# Patient Record
Sex: Female | Born: 1980 | Race: White | Hispanic: No | Marital: Married | Smoking: Never smoker
Health system: Southern US, Community
[De-identification: ages and names within clinical notes are randomized; demographics above are authoritative.]

## PROBLEM LIST (undated history)

## (undated) DIAGNOSIS — F419 Anxiety disorder, unspecified: Secondary | ICD-10-CM

## (undated) DIAGNOSIS — K5090 Crohn's disease, unspecified, without complications: Secondary | ICD-10-CM

## (undated) DIAGNOSIS — K589 Irritable bowel syndrome without diarrhea: Secondary | ICD-10-CM

## (undated) HISTORY — PX: NO PAST SURGERIES: SHX2092

## (undated) HISTORY — DX: Irritable bowel syndrome without diarrhea: K58.9

## (undated) HISTORY — DX: Anxiety disorder, unspecified: F41.9

## (undated) HISTORY — PX: BREAST LUMPECTOMY: SHX2

---

## 2017-03-30 ENCOUNTER — Other Ambulatory Visit: Payer: Self-pay | Admitting: Gastroenterology

## 2017-03-30 DIAGNOSIS — R110 Nausea: Secondary | ICD-10-CM

## 2017-03-30 DIAGNOSIS — R1033 Periumbilical pain: Secondary | ICD-10-CM

## 2017-03-30 DIAGNOSIS — R194 Change in bowel habit: Secondary | ICD-10-CM

## 2017-04-28 ENCOUNTER — Other Ambulatory Visit: Payer: Self-pay

## 2017-10-07 ENCOUNTER — Telehealth: Payer: Self-pay | Admitting: Gastroenterology

## 2017-10-07 NOTE — Telephone Encounter (Signed)
Records have been placed on Dr.Nandigam's desk for review.

## 2017-10-13 NOTE — Telephone Encounter (Signed)
Dr. Silverio Decamp reviewed records and has accepted patient. Ok to schedule Office Visit. Left message for patient to return my call

## 2017-10-14 ENCOUNTER — Encounter: Payer: Self-pay | Admitting: Gastroenterology

## 2017-11-02 DIAGNOSIS — E538 Deficiency of other specified B group vitamins: Secondary | ICD-10-CM

## 2017-11-02 HISTORY — DX: Deficiency of other specified B group vitamins: E53.8

## 2017-12-02 ENCOUNTER — Encounter: Payer: Self-pay | Admitting: Gastroenterology

## 2017-12-02 ENCOUNTER — Encounter (INDEPENDENT_AMBULATORY_CARE_PROVIDER_SITE_OTHER): Payer: Self-pay

## 2017-12-02 ENCOUNTER — Ambulatory Visit (INDEPENDENT_AMBULATORY_CARE_PROVIDER_SITE_OTHER)
Admission: RE | Admit: 2017-12-02 | Discharge: 2017-12-02 | Disposition: A | Discharge status: Home / Self Care | Payer: BLUE CROSS/BLUE SHIELD | Source: Ambulatory Visit | Attending: Gastroenterology | Admitting: Gastroenterology

## 2017-12-02 ENCOUNTER — Ambulatory Visit: Payer: BLUE CROSS/BLUE SHIELD | Admitting: Gastroenterology

## 2017-12-02 VITALS — BP 108/60 | HR 63 | Ht 65.2 in | Wt 165.6 lb

## 2017-12-02 DIAGNOSIS — K5909 Other constipation: Secondary | ICD-10-CM | POA: Diagnosis not present

## 2017-12-02 DIAGNOSIS — R197 Diarrhea, unspecified: Secondary | ICD-10-CM

## 2017-12-02 DIAGNOSIS — R109 Unspecified abdominal pain: Secondary | ICD-10-CM

## 2017-12-02 DIAGNOSIS — R1084 Generalized abdominal pain: Secondary | ICD-10-CM

## 2017-12-02 DIAGNOSIS — R110 Nausea: Secondary | ICD-10-CM | POA: Diagnosis not present

## 2017-12-02 MED ORDER — LINACLOTIDE 145 MCG PO CAPS: 145.0000 ug | ORAL_CAPSULE | Freq: Every day | ORAL | 0 refills | Status: DC

## 2017-12-02 NOTE — Progress Notes (Signed)
Lacey Hess    [id number]    07-Jul-1981  Primary Care Physician:Bailey, Lavella Lemons, CNM  Referring Physician: No referring provider defined for this encounter.  Chief complaint: Abdominal pain, constipation, nausea  HPI: 37 year old female here for new patient visit with complaints of generalized abdominal pain, more in the lower abdominal region worse in the past year.  She has always had issues with her bowels since she was a child.  She has chronic constipation with bowel movement every 4-5 days despite taking Colace every day at bedtime.  She has multiple bowel movements with formed to semi-formed stool, sometimes liquid for 1 or 2 days after no bowel movement for 4-5 days.  She does not feel like she has evacuated completely even when she has a bowel movement.  She feels bloated with excessive gas and also has nausea almost daily basis.  Denies any vomiting, dysphagia or odynophagia. Denies any weight loss.  No blood per rectum.  No family history of colon cancer or IBD.   Outpatient Encounter Medications as of 12/02/2017  Medication Sig  . AMBULATORY NON FORMULARY MEDICATION Medication Name: IB Guard one daily probiotic  . docusate sodium (COLACE) 100 MG capsule Take 100 mg by mouth 2 (two) times daily.  Marland Kitchen etonogestrel-ethinyl estradiol (NUVARING) 0.12-0.015 MG/24HR vaginal ring Place 1 each vaginally every 28 (twenty-eight) days. Insert vaginally and leave in place for 3 consecutive weeks, then remove for 1 week.  . linaclotide (LINZESS) 145 MCG CAPS capsule Take 1 capsule (145 mcg total) by mouth daily before breakfast.   No facility-administered encounter medications on file as of 12/02/2017.     Allergies as of 12/02/2017  . (No Known Allergies)    Past Medical History:  Diagnosis Date  . Anxiety   . IBS (irritable bowel syndrome)     History reviewed. No pertinent surgical history.  Family History  Problem Relation Age of Onset  . Breast cancer Maternal  Grandfather   . Diabetes Maternal Aunt     Social History   Socioeconomic History  . Marital status: Married    Spouse name: Not on file  . Number of children: Not on file  . Years of education: Not on file  . Highest education level: Not on file  Social Needs  . Financial resource strain: Not on file  . Food insecurity - worry: Not on file  . Food insecurity - inability: Not on file  . Transportation needs - medical: Not on file  . Transportation needs - non-medical: Not on file  Occupational History  . Not on file  Tobacco Use  . Smoking status: Never Smoker  . Smokeless tobacco: Never Used  Substance and Sexual Activity  . Alcohol use: No    Frequency: Never  . Drug use: No  . Sexual activity: Not on file  Other Topics Concern  . Not on file  Social History Narrative  . Not on file      Review of systems: Review of Systems  Constitutional: Negative for fever and chills. Positive for fatigue HENT: Negative.   Eyes: Negative for blurred vision.  Respiratory: Negative for cough, shortness of breath and wheezing.   Cardiovascular: Negative for chest pain and palpitations.  Gastrointestinal: as per HPI Genitourinary: Negative for dysuria, urgency, frequency and hematuria.  Musculoskeletal: Positive for myalgias, back pain and joint pain.  Skin: Negative for itching and rash.  Neurological: Negative for dizziness, tremors, focal weakness, seizures and loss  of consciousness.  Endo/Heme/Allergies: Positive for seasonal allergies.  Psychiatric/Behavioral: Negative for depression, suicidal ideas and hallucinations.  Positive for anxiety  All other systems reviewed and are negative.   Physical Exam: Vitals:   12/02/17 0850  BP: 108/60  Pulse: 63   Body mass index is 27.39 kg/m. Gen:      No acute distress HEENT:  EOMI, sclera anicteric Neck:     No masses; no thyromegaly Lungs:    Clear to auscultation bilaterally; normal respiratory effort CV:         Regular  rate and rhythm; no murmurs Abd:      + bowel sounds; soft, non-tender; no palpable masses, no distension Ext:    No edema; adequate peripheral perfusion Skin:      Warm and dry; no rash Neuro: alert and oriented x 3 Psych: normal mood and affect  Data Reviewed:  Reviewed labs, radiology imaging, old records and pertinent past GI work up   Assessment and Plan/Recommendations:  37 year old female with nausea, bloating, generalized abdominal pain associated with constipation with alternating diarrhea Patient appears to be suffering from constipation predominantly and likely has overflow diarrhea We will obtain abdominal x-ray If has evidence of excessive stool burden, consider bowel purge with MiraLAX and Gatorade Start Linzess 145 mcg daily Increase dietary fluid and fiber intake Nausea and excessive bloating likely related to constipation, if continues to have significant symptoms despite improvement of constipation will consider further evaluation Schedule for colonoscopy to exclude neoplastic lesion The risks and benefits as well as alternatives of endoscopic procedure(s) have been discussed and reviewed. All questions answered. The patient agrees to proceed.     Damaris Hippo , MD [phone] Mon-Fri 8a-5p [phone] after 5p, weekends, holidays  CC: No ref. provider found

## 2017-12-02 NOTE — Patient Instructions (Signed)
If you are age 35 or older, your body mass index should be between 23-30. Your Body mass index is 27.39 kg/m. If this is out of the aforementioned range listed, please consider follow up with your Primary Care Provider.  If you are age 33 or younger, your body mass index should be between 19-25. Your Body mass index is 27.39 kg/m. If this is out of the aformentioned range listed, please consider follow up with your Primary Care Provider.   Go to the basement today for your chest xray   You have been scheduled for a colonoscopy. Please follow written instructions given to you at your visit today.  Please pick up your prep supplies at the pharmacy within the next 1-3 days. If you use inhalers (even only as needed), please bring them with you on the day of your procedure.  We have given you samples of the following medication to take: Linzess 145 mcg if these work for you call us back for a prescription   Constipation, Adult Constipation is when a person:  Poops (has a bowel movement) fewer times in a week than normal.  Has a hard time pooping.  Has poop that is dry, hard, or bigger than normal.  Follow these instructions at home: Eating and drinking   Eat foods that have a lot of fiber, such as: ? Fresh fruits and vegetables. ? Whole grains. ? Beans.  Eat less of foods that are high in fat, low in fiber, or overly processed, such as: ? [place] fries. ? Hamburgers. ? Cookies. ? Candy. ? Soda.  Drink enough fluid to keep your pee (urine) clear or pale yellow. General instructions  Exercise regularly or as told by your doctor.  Go to the restroom when you feel like you need to poop. Do not hold it in.  Take over-the-counter and prescription medicines only as told by your doctor. These include any fiber supplements.  Do pelvic floor retraining exercises, such as: ? Doing deep breathing while relaxing your lower belly (abdomen). ? Relaxing your pelvic floor while  pooping.  Watch your condition for any changes.  Keep all follow-up visits as told by your doctor. This is important. Contact a doctor if:  You have pain that gets worse.  You have a fever.  You have not pooped for 4 days.  You throw up (vomit).  You are not hungry.  You lose weight.  You are bleeding from the anus.  You have thin, pencil-like poop (stool). Get help right away if:  You have a fever, and your symptoms suddenly get worse.  You leak poop or have blood in your poop.  Your belly feels hard or bigger than normal (is bloated).  You have very bad belly pain.  You feel dizzy or you faint. This information is not intended to replace advice given to you by your health care provider. Make sure you discuss any questions you have with your health care provider. Document Released: 04/06/2008 Document Revised: 05/08/2016 Document Reviewed: 04/08/2016 Elsevier Interactive Patient Education  2018 [employer].

## 2017-12-03 DIAGNOSIS — K5909 Other constipation: Secondary | ICD-10-CM | POA: Insufficient documentation

## 2017-12-03 DIAGNOSIS — K5090 Crohn's disease, unspecified, without complications: Secondary | ICD-10-CM

## 2017-12-03 HISTORY — PX: COLONOSCOPY: SHX174

## 2017-12-03 HISTORY — DX: Crohn's disease, unspecified, without complications: K50.90

## 2017-12-06 ENCOUNTER — Telehealth: Payer: Self-pay

## 2017-12-06 ENCOUNTER — Other Ambulatory Visit (INDEPENDENT_AMBULATORY_CARE_PROVIDER_SITE_OTHER): Payer: BLUE CROSS/BLUE SHIELD

## 2017-12-06 ENCOUNTER — Other Ambulatory Visit: Payer: Self-pay | Admitting: *Deleted

## 2017-12-06 ENCOUNTER — Encounter: Payer: Self-pay | Admitting: Gastroenterology

## 2017-12-06 ENCOUNTER — Ambulatory Visit (AMBULATORY_SURGERY_CENTER): Payer: BLUE CROSS/BLUE SHIELD | Admitting: Gastroenterology

## 2017-12-06 VITALS — BP 107/52 | HR 55 | Temp 98.2°F | Resp 10 | Ht 62.0 in | Wt 165.0 lb

## 2017-12-06 DIAGNOSIS — R109 Unspecified abdominal pain: Secondary | ICD-10-CM

## 2017-12-06 DIAGNOSIS — R1030 Lower abdominal pain, unspecified: Secondary | ICD-10-CM

## 2017-12-06 DIAGNOSIS — K5900 Constipation, unspecified: Secondary | ICD-10-CM

## 2017-12-06 DIAGNOSIS — K633 Ulcer of intestine: Secondary | ICD-10-CM | POA: Diagnosis not present

## 2017-12-06 DIAGNOSIS — K263 Acute duodenal ulcer without hemorrhage or perforation: Secondary | ICD-10-CM

## 2017-12-06 DIAGNOSIS — R197 Diarrhea, unspecified: Secondary | ICD-10-CM

## 2017-12-06 LAB — IBC PANEL
Iron: 128 ug/dL (ref 42–145)
Saturation Ratios: 28.4 % (ref 20.0–50.0)
Transferrin: 322 mg/dL (ref 212.0–360.0)

## 2017-12-06 LAB — COMPREHENSIVE METABOLIC PANEL
ALK PHOS: 43 U/L (ref 39–117)
ALT: 11 U/L (ref 0–35)
AST: 15 U/L (ref 0–37)
Albumin: 3.8 g/dL (ref 3.5–5.2)
BILIRUBIN TOTAL: 0.7 mg/dL (ref 0.2–1.2)
BUN: 4 mg/dL — AB (ref 6–23)
CO2: 23 meq/L (ref 19–32)
CREATININE: 0.7 mg/dL (ref 0.40–1.20)
Calcium: 8.9 mg/dL (ref 8.4–10.5)
Chloride: 104 mEq/L (ref 96–112)
GFR: 101.44 mL/min (ref >60.00)
GLUCOSE: 73 mg/dL (ref 70–99)
Potassium: 4.3 mEq/L (ref 3.5–5.1)
SODIUM: 138 meq/L (ref 135–145)
TOTAL PROTEIN: 6.5 g/dL (ref 6.0–8.3)

## 2017-12-06 LAB — CBC WITH DIFFERENTIAL/PLATELET
BASOS ABS: 0 10*3/uL (ref 0.0–0.1)
Basophils Relative: 1 % (ref 0.0–3.0)
Eosinophils Absolute: 0 10*3/uL (ref 0.0–0.7)
Eosinophils Relative: 0.3 % (ref 0.0–5.0)
HCT: 35.7 % — ABNORMAL LOW (ref 36.0–46.0)
Hemoglobin: 12.2 g/dL (ref 12.0–15.0)
LYMPHS ABS: 1.7 10*3/uL (ref 0.7–4.0)
Lymphocytes Relative: 36.7 % (ref 12.0–46.0)
MCHC: 34.1 g/dL (ref 30.0–36.0)
MCV: 91.1 fl (ref 78.0–100.0)
MONO ABS: 0.3 10*3/uL (ref 0.1–1.0)
MONOS PCT: 6.5 % (ref 3.0–12.0)
NEUTROS ABS: 2.6 10*3/uL (ref 1.4–7.7)
NEUTROS PCT: 55.5 % (ref 43.0–77.0)
PLATELETS: 223 10*3/uL (ref 150.0–400.0)
RBC: 3.92 Mil/uL (ref 3.87–5.11)
RDW: 13 % (ref 11.5–15.5)
WBC: 4.6 10*3/uL (ref 4.0–10.5)

## 2017-12-06 LAB — FOLATE: Folate: >23.6 ng/mL (ref >5.9)

## 2017-12-06 LAB — VITAMIN B12: Vitamin B-12: 221 pg/mL (ref 211–911)

## 2017-12-06 LAB — C-REACTIVE PROTEIN: CRP: 0.6 mg/dL (ref 0.5–20.0)

## 2017-12-06 LAB — FERRITIN: FERRITIN: 30.5 ng/mL (ref 10.0–291.0)

## 2017-12-06 MED ORDER — SODIUM CHLORIDE 0.9 % IV SOLN: 500.0000 mL | INTRAVENOUS | Status: DC

## 2017-12-06 MED ORDER — BUDESONIDE 3 MG PO CPEP: 9.0000 mg | ORAL_CAPSULE | Freq: Every day | ORAL | 3 refills | Status: DC

## 2017-12-06 NOTE — Patient Instructions (Signed)
Handouts given for Crohn's disease.  NO ASPIRIN, IBUPROFEN, NAPROXEN, OR OTHER NSAIDS.   YOU HAD AN ENDOSCOPIC PROCEDURE TODAY AT [facility]:   Refer to the procedure report that was given to you for any specific questions about what was found during the examination.  If the procedure report does not answer your questions, please call your gastroenterologist to clarify.  If you requested that your care partner not be given the details of your procedure findings, then the procedure report has been included in a sealed envelope for you to review at your convenience later.  YOU SHOULD EXPECT: Some feelings of bloating in the abdomen. Passage of more gas than usual.  Walking can help get rid of the air that was put into your GI tract during the procedure and reduce the bloating. If you had a lower endoscopy (such as a colonoscopy or flexible sigmoidoscopy) you may notice spotting of blood in your stool or on the toilet paper. If you underwent a bowel prep for your procedure, you may not have a normal bowel movement for a few days.  Please Note:  You might notice some irritation and congestion in your nose or some drainage.  This is from the oxygen used during your procedure.  There is no need for concern and it should clear up in a day or so.  SYMPTOMS TO REPORT IMMEDIATELY:   Following lower endoscopy (colonoscopy or flexible sigmoidoscopy):  Excessive amounts of blood in the stool  Significant tenderness or worsening of abdominal pains  Swelling of the abdomen that is new, acute  Fever of 100F or higher  For urgent or emergent issues, a gastroenterologist can be reached at any hour by calling [phone].   DIET:  We do recommend a small meal at first, but then you may proceed to your regular diet.  Drink plenty of fluids but you should avoid alcoholic beverages for 24 hours.  ACTIVITY:  You should plan to take it easy for the rest of today and you should NOT DRIVE or use  heavy machinery until tomorrow (because of the sedation medicines used during the test).    FOLLOW UP: Our staff will call the number listed on your records the next business day following your procedure to check on you and address any questions or concerns that you may have regarding the information given to you following your procedure. If we do not reach you, we will leave a message.  However, if you are feeling well and you are not experiencing any problems, there is no need to return our call.  We will assume that you have returned to your regular daily activities without incident.  If any biopsies were taken you will be contacted by phone or by letter within the next 1-3 weeks.  Please call us at [phone] if you have not heard about the biopsies in 3 weeks.    SIGNATURES/CONFIDENTIALITY: You and/or your care partner have signed paperwork which will be entered into your electronic medical record.  These signatures attest to the fact that that the information above on your After Visit Summary has been reviewed and is understood.  Full responsibility of the confidentiality of this discharge information lies with you and/or your care-partner.

## 2017-12-06 NOTE — Progress Notes (Signed)
Report given to PACU, vss 

## 2017-12-06 NOTE — Progress Notes (Signed)
Called to room to assist during endoscopic procedure.  Patient ID and intended procedure confirmed with present staff. Received instructions for my participation in the procedure from the performing physician.  

## 2017-12-06 NOTE — Progress Notes (Signed)
0920, Up to bathroom, simethicone given sublingual 0.25, RLQ pain 4-5.

## 2017-12-06 NOTE — Op Note (Signed)
[facility] Patient Name: Francetta Ilg Procedure Date: 12/06/2017 7:59 AM MRN: [id number] Endoscopist: Mauri Pole , MD Age: 37 Referring MD:  Date of Birth: 07-Mar-1981 Gender: Female Account #: [id number] Procedure:                Colonoscopy Indications:              Obtain more precise diagnosis of inflammatory bowel                            disease, Generalized abdominal pain Medicines:                Monitored Anesthesia Care Procedure:                Pre-Anesthesia Assessment:                           - Prior to the procedure, a History and Physical                            was performed, and patient medications and                            allergies were reviewed. The patient's tolerance of                            previous anesthesia was also reviewed. The risks                            and benefits of the procedure and the sedation                            options and risks were discussed with the patient.                            All questions were answered, and informed consent                            was obtained. Prior Anticoagulants: The patient has                            taken no previous anticoagulant or antiplatelet                            agents. ASA Grade Assessment: I - A normal, healthy                            patient. After reviewing the risks and benefits,                            the patient was deemed in satisfactory condition to                            undergo the procedure.  After obtaining informed consent, the colonoscope                            was passed under direct vision. Throughout the                            procedure, the patient's blood pressure, pulse, and                            oxygen saturations were monitored continuously. The                            Colonoscope was introduced through the anus and                            advanced to the the terminal  ileum, with                            identification of the appendiceal orifice and IC                            valve. The colonoscopy was performed without                            difficulty. The patient tolerated the procedure                            well. The quality of the bowel preparation was                            excellent. The terminal ileum, ileocecal valve,                            appendiceal orifice, and rectum were photographed. Scope In: 8:04:18 AM Scope Out: 8:26:28 AM Scope Withdrawal Time: 0 hours 13 minutes 14 seconds  Total Procedure Duration: 0 hours 22 minutes 10 seconds  Findings:                 The perianal and digital rectal examinations were                            normal.                           The colon (entire examined portion) appeared                            normal. Biopsies were taken with a cold forceps for                            histology from rectum to exclude Crohn's disease                           The terminal ileum contained multiple ulcers with  erythematous friable mucosa. No active bleeding was                            present. No stigmata of recent bleeding were seen.                            Biopsies were taken with a cold forceps for                            histology to exclude/confirm Crohn's disease . Complications:            No immediate complications. Estimated Blood Loss:     Estimated blood loss was minimal. Impression:               - The entire examined colon is normal. Biopsied.                           - Multiple ulcers in the terminal ileum. Biopsied. Recommendation:           - Patient has a contact number available for                            emergencies. The signs and symptoms of potential                            delayed complications were discussed with the                            patient. Return to normal activities tomorrow.                             Written discharge instructions were provided to the                            patient.                           - Resume previous diet.                           - Continue present medications.                           - Await pathology results.                           - No aspirin, ibuprofen, naproxen, or other                            non-steroidal anti-inflammatory drugs.                           - Perform a computed tomographic (CT scan)                            enterography at appointment to be scheduled.                           -  Check CBC, CMP, CRP, Ferrition, iron panel,                            folate, B12, Hep A, B, & C and Quantiferon gold TB                            to be scheduled                           - Use budesonide 9 mg PO one time per day X 30 days                            with 3 refills.                           - Repeat colonoscopy date to be determined after                            pending pathology results are reviewed for                            surveillance after piecemeal polypectomy.                           - Return to GI clinic in 1 month. Mauri Pole, MD 12/06/2017 8:36:21 AM This report has been signed electronically.

## 2017-12-06 NOTE — Telephone Encounter (Signed)
Patient calls due to persistent abdominal discomfort after the colonoscopy this morning. She describes it as a gassy cramping pain that centers near the umbilicus. It is different from the abdominal pain she usually experiences. She does feel it has increased in intensity since this morning. She is passing gas but this does not decrease her level of discomfort. She is not nauseous. Please advise. Thank you. Patient can be reached at [phone]

## 2017-12-06 NOTE — Progress Notes (Signed)
Pt's states no medical or surgical changes since previsit or office visit. 

## 2017-12-06 NOTE — Progress Notes (Signed)
0920-- pt in bathroom, simethicone given.   0930-- hot tea given, pt on hands and knees, reports she is feeling some better

## 2017-12-06 NOTE — Telephone Encounter (Signed)
Called patient back. She feels full and gassy, has pressure in the mid abdomen. Tolerating PO. No fever, nausea or vomiting. Advised patient to come to ER for evaluation. She had aphthous ulcers and inflammation in the terminal ileum concerning for Crohn's

## 2017-12-07 ENCOUNTER — Telehealth: Payer: Self-pay

## 2017-12-07 ENCOUNTER — Other Ambulatory Visit: Payer: Self-pay

## 2017-12-07 MED ORDER — CYANOCOBALAMIN 1000 MCG/ML IJ SOLN: 1000.0000 ug | INTRAMUSCULAR | 12 refills | Status: DC

## 2017-12-07 NOTE — Telephone Encounter (Signed)
Name identifier, left a voice message.

## 2017-12-07 NOTE — Telephone Encounter (Signed)
  Follow up Call-  Call back number 12/06/2017  Post procedure Call Back phone  # [phone]  Permission to leave phone message Yes     Left message

## 2017-12-07 NOTE — Telephone Encounter (Signed)
ok 

## 2017-12-07 NOTE — Telephone Encounter (Signed)
Spoke with the patient. She has returned to work. No further problems or discomfort. She did not go to ED or an urgent care.

## 2017-12-08 ENCOUNTER — Ambulatory Visit: Payer: BLUE CROSS/BLUE SHIELD | Admitting: Gastroenterology

## 2017-12-08 ENCOUNTER — Ambulatory Visit (INDEPENDENT_AMBULATORY_CARE_PROVIDER_SITE_OTHER)
Admission: RE | Admit: 2017-12-08 | Discharge: 2017-12-08 | Disposition: A | Discharge status: Home / Self Care | Payer: BLUE CROSS/BLUE SHIELD | Source: Ambulatory Visit | Attending: Gastroenterology | Admitting: Gastroenterology

## 2017-12-08 DIAGNOSIS — R109 Unspecified abdominal pain: Secondary | ICD-10-CM

## 2017-12-08 DIAGNOSIS — K633 Ulcer of intestine: Secondary | ICD-10-CM | POA: Diagnosis not present

## 2017-12-08 DIAGNOSIS — E538 Deficiency of other specified B group vitamins: Secondary | ICD-10-CM

## 2017-12-08 LAB — HEPATITIS B SURFACE ANTIGEN: HEP B S AG: NONREACTIVE

## 2017-12-08 LAB — QUANTIFERON-TB GOLD PLUS
Mitogen-NIL: >10 IU/mL
NIL: 0.08 IU/mL
QuantiFERON-TB Gold Plus: NEGATIVE
TB1-NIL: 0.01 IU/mL
TB2-NIL: 0.01 IU/mL

## 2017-12-08 LAB — HEPATITIS A ANTIBODY, TOTAL: Hepatitis A AB,Total: REACTIVE — AB

## 2017-12-08 LAB — HEPATITIS B SURFACE ANTIBODY,QUALITATIVE: HEP B S AB: REACTIVE — AB

## 2017-12-08 LAB — HEPATITIS C ANTIBODY
HEP C AB: NONREACTIVE
SIGNAL TO CUT-OFF: 0.02 (ref <1.00)

## 2017-12-08 MED ORDER — IOPAMIDOL (ISOVUE-300) INJECTION 61%
100.0000 mL | Freq: Once | INTRAVENOUS | Status: AC | PRN
  Administered 2017-12-08: 100 mL via INTRAVENOUS

## 2017-12-09 ENCOUNTER — Other Ambulatory Visit: Payer: Self-pay

## 2017-12-09 MED ORDER — LINACLOTIDE 145 MCG PO CAPS: 145.0000 ug | ORAL_CAPSULE | Freq: Every day | ORAL | 3 refills | Status: DC

## 2017-12-09 NOTE — Progress Notes (Signed)
Patient comes in for instructions to self administer her B12 injections. Patient demonstrated proper technique, washing her hands, injection site prep, drawing up the injection , self administering and disposal of used syringe.  Questions invited and answered.  Her partner was in attendance to support her and learn also.

## 2017-12-15 ENCOUNTER — Telehealth: Payer: Self-pay | Admitting: *Deleted

## 2017-12-15 NOTE — Telephone Encounter (Signed)
Dr. Silverio Decamp,  When would you like a recall colonoscopy for this patient?  Thank you, Olivia Mackie

## 2017-12-15 NOTE — Telephone Encounter (Signed)
10 years recall for colonoscopy. Thanks

## 2018-02-03 ENCOUNTER — Telehealth: Payer: Self-pay | Admitting: Gastroenterology

## 2018-02-03 NOTE — Telephone Encounter (Signed)
Left message to call.

## 2018-02-04 NOTE — Telephone Encounter (Signed)
Spoke with the patient. She is experiencing constipation despite Linzess 145 mcg. She is wanting to use Miralax and asks about purging. Okay to purge? Should she continue with daily Miralax or increase the Linzess? She has an appointment on 02/08/18 for follow up.

## 2018-02-04 NOTE — Telephone Encounter (Signed)
Left the patient a message offering this advice and samples of Linzess 290 mcg to pick up at the front desk.

## 2018-02-04 NOTE — Telephone Encounter (Signed)
Patient returned phone call stating that she is in [place] and would love samples but I did not see any samples at the front desk for her.

## 2018-02-04 NOTE — Telephone Encounter (Signed)
Ok to do bowel purge with Miralax. Please increase Linzess dose to 249mg daily. Follow up in office visit. Thanks

## 2018-02-04 NOTE — Telephone Encounter (Signed)
The samples are at the front desk, they just got buried in the drawer. Patient is no longer in [place] but will try to get someone to come in for the samples.

## 2018-02-04 NOTE — Telephone Encounter (Signed)
Patient returned phone call. °

## 2018-02-08 ENCOUNTER — Encounter: Payer: Self-pay | Admitting: Gastroenterology

## 2018-02-08 ENCOUNTER — Encounter (INDEPENDENT_AMBULATORY_CARE_PROVIDER_SITE_OTHER): Payer: Self-pay

## 2018-02-08 ENCOUNTER — Ambulatory Visit: Payer: BLUE CROSS/BLUE SHIELD | Admitting: Gastroenterology

## 2018-02-08 ENCOUNTER — Ambulatory Visit (INDEPENDENT_AMBULATORY_CARE_PROVIDER_SITE_OTHER)
Admission: RE | Admit: 2018-02-08 | Discharge: 2018-02-08 | Disposition: A | Discharge status: Home / Self Care | Payer: BLUE CROSS/BLUE SHIELD | Source: Ambulatory Visit | Attending: Gastroenterology | Admitting: Gastroenterology

## 2018-02-08 VITALS — BP 94/60 | HR 60 | Ht 65.75 in | Wt 162.0 lb

## 2018-02-08 DIAGNOSIS — K5000 Crohn's disease of small intestine without complications: Secondary | ICD-10-CM | POA: Diagnosis not present

## 2018-02-08 DIAGNOSIS — K5909 Other constipation: Secondary | ICD-10-CM

## 2018-02-08 DIAGNOSIS — R197 Diarrhea, unspecified: Secondary | ICD-10-CM

## 2018-02-08 DIAGNOSIS — R140 Abdominal distension (gaseous): Secondary | ICD-10-CM | POA: Diagnosis not present

## 2018-02-08 DIAGNOSIS — R109 Unspecified abdominal pain: Secondary | ICD-10-CM | POA: Diagnosis not present

## 2018-02-08 DIAGNOSIS — R1030 Lower abdominal pain, unspecified: Secondary | ICD-10-CM

## 2018-02-08 DIAGNOSIS — Z8719 Personal history of other diseases of the digestive system: Secondary | ICD-10-CM | POA: Diagnosis not present

## 2018-02-08 MED ORDER — BUDESONIDE 3 MG PO CPEP: 9.0000 mg | ORAL_CAPSULE | Freq: Every day | ORAL | 3 refills | Status: DC

## 2018-02-08 NOTE — Patient Instructions (Signed)
Your provider has requested that you have an abdominal x ray before leaving today. Please go to the basement floor to our Radiology department for the test.  We will send for approval for Humira treatments, Beth Dr Jillyn Hidden nurse will contact you about the approval   We will refill Budesonide   Take benefiber 1 teaspoon three times a day with meals , start once a day and increase daily

## 2018-02-08 NOTE — Progress Notes (Signed)
Lacey Hess    [id number]    10-Dec-1980  Primary Care Physician:Bailey, Lavella Lemons, CNM  Referring Physician: Artelia Laroche, [address], [place] [address]  Chief complaint: Crohn's disease HPI: 37 year old female with recent diagnosis of Crohn's disease based on colonoscopy and CT enterography. Colonoscopy December 06, 2017 showed multiple aphthous ulcers in the terminal ileum, biopsies showed erosions and active inflammation.  CT enterography with contrast showed mild wall thickening and abnormal mucosal enhancement in the terminal ileum highly suspicious for Crohn's disease.  Started on budesonide 9 mg daily.  Reports improvement of abdominal pain and has less bloating since starting budesonide.  She continues to have irregular bowel habits with alternating constipation and diarrhea.  Denies any melena or rectal bleeding.  Weight is stable     Outpatient Encounter Medications as of 02/08/2018  Medication Sig  . AMBULATORY NON FORMULARY MEDICATION Medication Name: IB Guard one daily probiotic  . budesonide (ENTOCORT EC) 3 MG 24 hr capsule Take 3 capsules (9 mg total) by mouth daily.  . cyanocobalamin (,VITAMIN B-12,) 1000 MCG/ML injection Inject 1 mL (1,000 mcg total) into the muscle every 30 (thirty) days.  Marland Kitchen etonogestrel-ethinyl estradiol (NUVARING) 0.12-0.015 MG/24HR vaginal ring Place 1 each vaginally every 28 (twenty-eight) days. Insert vaginally and leave in place for 3 consecutive weeks, then remove for 1 week.  . linaclotide (LINZESS) 145 MCG CAPS capsule Take 1 capsule (145 mcg total) by mouth daily before breakfast.  . docusate sodium (COLACE) 100 MG capsule Take 100 mg by mouth 2 (two) times daily.   Facility-Administered Encounter Medications as of 02/08/2018  Medication  . 0.9 %  sodium chloride infusion    Allergies as of 02/08/2018  . (No Known Allergies)    Past Medical History:  Diagnosis Date  . Anxiety   . IBS (irritable bowel syndrome)      History reviewed. No pertinent surgical history.  Family History  Problem Relation Age of Onset  . Breast cancer Maternal Grandfather   . Diabetes Maternal Aunt     Social History   Socioeconomic History  . Marital status: Married    Spouse name: Not on file  . Number of children: Not on file  . Years of education: Not on file  . Highest education level: Not on file  Occupational History  . Not on file  Social Needs  . Financial resource strain: Not on file  . Food insecurity:    Worry: Not on file    Inability: Not on file  . Transportation needs:    Medical: Not on file    Non-medical: Not on file  Tobacco Use  . Smoking status: Never Smoker  . Smokeless tobacco: Never Used  Substance and Sexual Activity  . Alcohol use: No    Frequency: Never  . Drug use: No  . Sexual activity: Not on file  Lifestyle  . Physical activity:    Days per week: Not on file    Minutes per session: Not on file  . Stress: Not on file  Relationships  . Social connections:    Talks on phone: Not on file    Gets together: Not on file    Attends religious service: Not on file    Active member of club or organization: Not on file    Attends meetings of clubs or organizations: Not on file    Relationship status: Not on file  . Intimate partner violence:  Fear of current or ex partner: Not on file    Emotionally abused: Not on file    Physically abused: Not on file    Forced sexual activity: Not on file  Other Topics Concern  . Not on file  Social History Narrative  . Not on file      Review of systems: Review of Systems  Constitutional: Negative for fever and chills.  Positive for lack of energy HENT: Negative.   Eyes: Negative for blurred vision.  Respiratory: Negative for cough, shortness of breath and wheezing.   Cardiovascular: Negative for chest pain and palpitations.  Gastrointestinal: as per HPI Genitourinary: Negative for dysuria, urgency, frequency and  hematuria.  Musculoskeletal: Negative for myalgias, back pain and joint pain.  Skin: Negative for itching and rash.  Neurological: Negative for dizziness, tremors, focal weakness, seizures and loss of consciousness.  Endo/Heme/Allergies: Negative for seasonal allergies.  Psychiatric/Behavioral: Negative for depression, suicidal ideas and hallucinations.  All other systems reviewed and are negative.   Physical Exam: Vitals:   02/08/18 0848  BP: 94/60  Pulse: 60   Body mass index is 26.35 kg/m. Gen:      No acute distress HEENT:  EOMI, sclera anicteric Neck:     No masses; no thyromegaly Lungs:    Clear to auscultation bilaterally; normal respiratory effort CV:         Regular rate and rhythm; no murmurs Abd:      + bowel sounds; soft, non-tender; no palpable masses, no distension Ext:    No edema; adequate peripheral perfusion Skin:      Warm and dry; no rash Neuro: alert and oriented x 3 Psych: normal mood and affect  Data Reviewed:  Reviewed labs, radiology imaging, old records and pertinent past GI work up   Assessment and Plan/Recommendations:  37 year old female with recent diagnosis of Crohn's disease with predominant involvement of small bowel specifically terminal ileum Symptoms somewhat improved with budesonide 9 mg daily, continue until can intiate on anti-TNF and achieves remission  We will submit for insurance approval to initiate Humira TB QuantiFERON negative December 06, 2017 Hepatitis B and A immune Discussed annual health maintenance with TB screening, flu vaccination and skin exam while on anti-TNF therapy  Check abdominal x-ray to evaluate for increased stool burden given complaints of abdominal fullness and alternating constipation with diarrhea, if has evidence of increased stool burden will do a bowel purge Discussed small frequent meals Start soluble fiber, Benefiber 1 teaspoon 3 times daily with meals Avoid high-fiber diet Return in 2-3 months or  sooner if needed  25 minutes was spent face-to-face with the patient. Greater than 50% of the time used for counseling as well as treatment plan and follow-up. She had multiple questions which were answered to her satisfaction  K. Denzil Magnuson , MD [phone]    CC: Artelia Laroche, CNM

## 2018-02-10 ENCOUNTER — Encounter: Payer: Self-pay | Admitting: Gastroenterology

## 2018-02-15 ENCOUNTER — Other Ambulatory Visit: Payer: Self-pay

## 2018-02-15 MED ORDER — ADALIMUMAB 40 MG/0.4ML ~~LOC~~ AJKT: 1.0000 "pen " | AUTO-INJECTOR | SUBCUTANEOUS | 6 refills | Status: DC

## 2018-02-16 ENCOUNTER — Telehealth: Payer: Self-pay | Admitting: Gastroenterology

## 2018-02-16 NOTE — Telephone Encounter (Signed)
PA submitted through Cover My Meds.

## 2018-02-22 ENCOUNTER — Other Ambulatory Visit: Payer: Self-pay

## 2018-02-22 NOTE — Telephone Encounter (Signed)
Medications they are referring to are corticosteroids, 5-aminosalicylate, azathioprine, 6 mp, metronidazole and methotrexate. Must have tried and failed.  She must have moderate to severe Crohn's.

## 2018-02-22 NOTE — Telephone Encounter (Signed)
She has moderate Crohn's disease and failed steroids (budesonide).

## 2018-02-22 NOTE — Telephone Encounter (Signed)
Humira has been denied. She has not tried and failed standard medications.

## 2018-02-22 NOTE — Telephone Encounter (Signed)
Records submitted for a "PCR". Faxed to [facility] [phone]. (Physician consult request)

## 2018-02-22 NOTE — Telephone Encounter (Signed)
Humira is supposed to be the standard medication, can you please ask them to clarify, what they want to her to fail before approval of Humira?

## 2018-02-28 NOTE — Telephone Encounter (Signed)
Approval received. BC[employer] reference number [id number] Effective dates of authorization: 02/16/2018 through 11/01/2038  Tried to call the patient back. Her voicemail is not set up.   Called the pharmacy [facility]. The pharmacy will be reaching out to the patient to set up delivery. They will not send the Rx until they have talked with her. Tried to call the patient again. No answer.  Pharmacy number [phone] Fax [phone]

## 2018-02-28 NOTE — Telephone Encounter (Signed)
Pt called to follow up on letter sent to her insurance about refill for humira.

## 2018-03-01 NOTE — Telephone Encounter (Signed)
Tried to call the patient back. Her mailbox is full and cannot accept messages.

## 2018-03-01 NOTE — Telephone Encounter (Signed)
Spoke with [name]. She will give the pharmacy phone number to her.

## 2018-03-01 NOTE — Telephone Encounter (Signed)
Patient states she received a missed call from [place] not sure what it was regarding. Best call back # [phone]

## 2018-03-11 ENCOUNTER — Telehealth: Payer: Self-pay | Admitting: Gastroenterology

## 2018-03-11 MED ORDER — LINACLOTIDE 145 MCG PO CAPS: 145.0000 ug | ORAL_CAPSULE | Freq: Every day | ORAL | 3 refills | Status: DC

## 2018-03-11 NOTE — Telephone Encounter (Signed)
Linzess sent to [facility] as requested

## 2018-03-25 ENCOUNTER — Telehealth: Payer: Self-pay | Admitting: Gastroenterology

## 2018-03-29 ENCOUNTER — Other Ambulatory Visit: Payer: Self-pay

## 2018-03-29 MED ORDER — LINACLOTIDE 290 MCG PO CAPS: 290.0000 ug | ORAL_CAPSULE | Freq: Every day | ORAL | 3 refills | Status: DC

## 2018-03-29 NOTE — Telephone Encounter (Signed)
Rx to pharmacy. Message left for the patient to advise of this.

## 2018-03-29 NOTE — Telephone Encounter (Signed)
1)She will be without insurance for the month of July. Can we ask the drug rep for 2 or 3 samples of Humira to help her? 2)48 hours after last injection she developed a headache that lasted 3 days. It was "pretty severe the first day, then became a dull h/a" No history of migraines. Birth control is Nuva ring. 3)Wants Linzess 290 mcg. Okay to send Rx?

## 2018-03-29 NOTE — Telephone Encounter (Signed)
Okay to send prescription for Linzess 290 mcg daily, 90-day supply. Agree with trying to get samples for Humira so she does not have any interruption with her injections. Please advise patient to premedicate with 500 mg Tylenol and 25 mg Benadryl prior to injecting Humira to see if that improves her symptoms.

## 2018-03-30 NOTE — Telephone Encounter (Signed)
email to Humira rep.

## 2018-04-08 ENCOUNTER — Other Ambulatory Visit: Payer: Self-pay | Admitting: *Deleted

## 2018-04-08 DIAGNOSIS — R1030 Lower abdominal pain, unspecified: Secondary | ICD-10-CM

## 2018-04-08 MED ORDER — BUDESONIDE 3 MG PO CPEP: 9.0000 mg | ORAL_CAPSULE | Freq: Every day | ORAL | 3 refills | Status: DC

## 2018-04-08 NOTE — Progress Notes (Signed)
Sent in rx per fax request from [facility]

## 2018-04-12 ENCOUNTER — Other Ambulatory Visit: Payer: Self-pay | Admitting: *Deleted

## 2018-04-12 MED ORDER — CYANOCOBALAMIN 1000 MCG/ML IJ SOLN: 1000.0000 ug | INTRAMUSCULAR | 12 refills | Status: DC

## 2018-04-12 MED ORDER — LINACLOTIDE 290 MCG PO CAPS
290.0000 ug | ORAL_CAPSULE | Freq: Every day | ORAL | 3 refills | Status: DC
Start: 2018-04-12 — End: 2018-05-10

## 2018-04-12 NOTE — Progress Notes (Signed)
Refill request fax from [facility] for Linzess and B12 injectable  Sent electronically today

## 2018-04-19 ENCOUNTER — Ambulatory Visit: Payer: BLUE CROSS/BLUE SHIELD | Admitting: Gastroenterology

## 2018-04-26 ENCOUNTER — Ambulatory Visit (INDEPENDENT_AMBULATORY_CARE_PROVIDER_SITE_OTHER): Payer: PRIVATE HEALTH INSURANCE | Admitting: Gastroenterology

## 2018-04-26 ENCOUNTER — Encounter: Payer: Self-pay | Admitting: Gastroenterology

## 2018-04-26 VITALS — BP 96/60 | HR 60 | Ht 65.75 in | Wt 160.4 lb

## 2018-04-26 DIAGNOSIS — K5900 Constipation, unspecified: Secondary | ICD-10-CM

## 2018-04-26 DIAGNOSIS — K5000 Crohn's disease of small intestine without complications: Secondary | ICD-10-CM

## 2018-04-26 NOTE — Progress Notes (Signed)
Lacey Hess    [id number]    09/24/1981  Primary Care Physician:Bailey, Lavella Lemons, CNM  Referring Physician: Artelia Laroche, [address], [place] [address]  Chief complaint:  Crohn's disease  HPI: 37 year old female with Crohn's disease predominantly ileal involvement, diagnosed in February 2019 initiated on Humira as patient had persistent symptoms despite steroids.  She is currently on maintenance dose of Humira every other week and she feels her symptoms are less severe and also she is having less frequent symptoms, predominantly abdominal bloating and discomfort. Bowel movements are more regular, once every other day. Soft stool on most days but sometimes has loose stool.  She is taking Linzess 290 mcg daily. Denies any nausea, vomiting, abdominal pain, melena or bright red blood per rectum    Outpatient Encounter Medications as of 04/26/2018  Medication Sig  . Adalimumab (HUMIRA PEN) 40 MG/0.4ML PNKT Inject 1 pen into the skin every 14 (fourteen) days.  . budesonide (ENTOCORT EC) 3 MG 24 hr capsule Take 3 capsules (9 mg total) by mouth daily.  . cyanocobalamin (,VITAMIN B-12,) 1000 MCG/ML injection Inject 1 mL (1,000 mcg total) into the muscle every 30 (thirty) days.  Marland Kitchen etonogestrel-ethinyl estradiol (NUVARING) 0.12-0.015 MG/24HR vaginal ring Place 1 each vaginally every 28 (twenty-eight) days. Insert vaginally and leave in place for 3 consecutive weeks, then remove for 1 week.  . linaclotide (LINZESS) 290 MCG CAPS capsule Take 1 capsule (290 mcg total) by mouth daily before breakfast.  . [DISCONTINUED] AMBULATORY NON FORMULARY MEDICATION Medication Name: IB Guard one daily probiotic  . [DISCONTINUED] docusate sodium (COLACE) 100 MG capsule Take 100 mg by mouth 2 (two) times daily.   Facility-Administered Encounter Medications as of 04/26/2018  Medication  . 0.9 %  sodium chloride infusion    Allergies as of 04/26/2018  . (No Known Allergies)    Past  Medical History:  Diagnosis Date  . Anxiety   . IBS (irritable bowel syndrome)     History reviewed. No pertinent surgical history.  Family History  Problem Relation Age of Onset  . Breast cancer Maternal Grandfather   . Diabetes Maternal Aunt     Social History   Socioeconomic History  . Marital status: Married    Spouse name: Not on file  . Number of children: Not on file  . Years of education: Not on file  . Highest education level: Not on file  Occupational History  . Not on file  Social Needs  . Financial resource strain: Not on file  . Food insecurity:    Worry: Not on file    Inability: Not on file  . Transportation needs:    Medical: Not on file    Non-medical: Not on file  Tobacco Use  . Smoking status: Never Smoker  . Smokeless tobacco: Never Used  Substance and Sexual Activity  . Alcohol use: No    Frequency: Never  . Drug use: No  . Sexual activity: Not on file  Lifestyle  . Physical activity:    Days per week: Not on file    Minutes per session: Not on file  . Stress: Not on file  Relationships  . Social connections:    Talks on phone: Not on file    Gets together: Not on file    Attends religious service: Not on file    Active member of club or organization: Not on file    Attends meetings of clubs or  organizations: Not on file    Relationship status: Not on file  . Intimate partner violence:    Fear of current or ex partner: Not on file    Emotionally abused: Not on file    Physically abused: Not on file    Forced sexual activity: Not on file  Other Topics Concern  . Not on file  Social History Narrative  . Not on file      Review of systems: Review of Systems  Constitutional: Negative for fever and chills.  HENT: Positive for runny nose.   Eyes: Negative for blurred vision.  Respiratory: Negative for cough, shortness of breath and wheezing.   Cardiovascular: Negative for chest pain and palpitations.  Gastrointestinal: as per  HPI Genitourinary: Negative for dysuria, urgency, frequency and hematuria.  Musculoskeletal: Negative for myalgias, back pain and joint pain.  Skin: Negative for itching and rash.  Neurological: Negative for dizziness, tremors, focal weakness, seizures and loss of consciousness.  Endo/Heme/Allergies: Negative for seasonal allergies.  Psychiatric/Behavioral: Negative for depression, suicidal ideas and hallucinations.  All other systems reviewed and are negative.   Physical Exam: Vitals:   04/26/18 1324  BP: 96/60  Pulse: 60   Body mass index is 26.08 kg/m. Gen:      No acute distress HEENT:  EOMI, sclera anicteric Neck:     No masses; no thyromegaly Lungs:    Clear to auscultation bilaterally; normal respiratory effort CV:         Regular rate and rhythm; no murmurs Abd:      + bowel sounds; soft, non-tender; no palpable masses, no distension Ext:    No edema; adequate peripheral perfusion Skin:      Warm and dry; no rash Neuro: alert and oriented x 3 Psych: normal mood and affect  Data Reviewed:  Reviewed labs, radiology imaging, old records and pertinent past GI work up   Assessment and Plan/Recommendations:  37 year old female with history of Crohn's disease, predominantly involving ileum Continue Humira 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  25 minutes was spent face-to-face with the patient. Greater than 50% of the time used for counseling as well as  treatment plan and follow-up. She had multiple questions which were answered to her satisfaction  K. Denzil Magnuson , MD [phone]    CC: Artelia Laroche, CNM

## 2018-04-26 NOTE — Patient Instructions (Addendum)
We have given you Linzess 145 mcg samples to try, if you need a prescription give [place] a call  Follow up in 6 months  If you are age 35 or older, your body mass index should be between 23-30. Your Body mass index is 26.08 kg/m. If this is out of the aforementioned range listed, please consider follow up with your Primary Care Provider.  If you are age 18 or younger, your body mass index should be between 19-25. Your Body mass index is 26.08 kg/m. If this is out of the aformentioned range listed, please consider follow up with your Primary Care Provider.    Follow up in 6 months

## 2018-05-01 ENCOUNTER — Encounter: Payer: Self-pay | Admitting: Gastroenterology

## 2018-05-10 ENCOUNTER — Telehealth: Payer: Self-pay | Admitting: Gastroenterology

## 2018-05-10 MED ORDER — LINACLOTIDE 145 MCG PO CAPS: 145.0000 ug | ORAL_CAPSULE | Freq: Every day | ORAL | 6 refills | Status: DC

## 2018-05-10 NOTE — Telephone Encounter (Signed)
Prescription sent to [facility] as requested by patient

## 2018-05-13 ENCOUNTER — Telehealth: Payer: Self-pay | Admitting: Gastroenterology

## 2018-05-13 NOTE — Telephone Encounter (Signed)
Informed patient we can leave samples of Linzess at the front desk. Patient states she will come by and get them.

## 2018-05-13 NOTE — Telephone Encounter (Signed)
Left a message for patient to return my call. 

## 2018-05-17 ENCOUNTER — Telehealth: Payer: Self-pay | Admitting: Gastroenterology

## 2018-05-17 NOTE — Telephone Encounter (Signed)
Spoke with the patient. [facility] is her new pharmacy Transport pl[facility].  Will contact the drug rep for Humira to ask for another sample. She will be due in 2 weeks. This would ensure she had continuity of her Humira injections due to the possible delay because the prior authorization process or a high co-pay.

## 2018-05-19 NOTE — Telephone Encounter (Signed)
Still waiting on her new insurance card.

## 2018-05-25 DIAGNOSIS — K5000 Crohn's disease of small intestine without complications: Secondary | ICD-10-CM | POA: Insufficient documentation

## 2018-06-15 ENCOUNTER — Other Ambulatory Visit: Payer: Self-pay

## 2018-06-15 MED ORDER — ADALIMUMAB 40 MG/0.4ML ~~LOC~~ AJKT: 1.0000 "pen " | AUTO-INJECTOR | SUBCUTANEOUS | 6 refills | Status: DC

## 2018-08-29 ENCOUNTER — Encounter (HOSPITAL_COMMUNITY): Payer: Self-pay | Admitting: Emergency Medicine

## 2018-08-29 ENCOUNTER — Other Ambulatory Visit: Payer: Self-pay

## 2018-08-29 ENCOUNTER — Telehealth: Payer: Self-pay | Admitting: Gastroenterology

## 2018-08-29 ENCOUNTER — Emergency Department (HOSPITAL_COMMUNITY)
Admission: EM | Admit: 2018-08-29 | Discharge: 2018-08-29 | Disposition: A | Discharge status: Home / Self Care | Payer: BC Managed Care – PPO | Attending: Emergency Medicine | Admitting: Emergency Medicine

## 2018-08-29 DIAGNOSIS — R1084 Generalized abdominal pain: Secondary | ICD-10-CM | POA: Insufficient documentation

## 2018-08-29 DIAGNOSIS — K5090 Crohn's disease, unspecified, without complications: Secondary | ICD-10-CM | POA: Diagnosis not present

## 2018-08-29 DIAGNOSIS — Z79899 Other long term (current) drug therapy: Secondary | ICD-10-CM | POA: Diagnosis not present

## 2018-08-29 HISTORY — DX: Crohn's disease, unspecified, without complications: K50.90

## 2018-08-29 LAB — URINALYSIS, ROUTINE W REFLEX MICROSCOPIC
BACTERIA UA: NONE SEEN
BILIRUBIN URINE: NEGATIVE
GLUCOSE, UA: NEGATIVE mg/dL
KETONES UR: NEGATIVE mg/dL
Leukocytes, UA: NEGATIVE
Nitrite: NEGATIVE
PH: 5 (ref 5.0–8.0)
Protein, ur: NEGATIVE mg/dL
Specific Gravity, Urine: 1.031 — ABNORMAL HIGH (ref 1.005–1.030)

## 2018-08-29 LAB — CBC
HEMATOCRIT: 40.2 % (ref 36.0–46.0)
Hemoglobin: 12.1 g/dL (ref 12.0–15.0)
MCH: 30 pg (ref 26.0–34.0)
MCHC: 30.1 g/dL (ref 30.0–36.0)
MCV: 99.5 fL (ref 80.0–100.0)
Platelets: 232 10*3/uL (ref 150–400)
RBC: 4.04 MIL/uL (ref 3.87–5.11)
RDW: 13 % (ref 11.5–15.5)
WBC: 6 10*3/uL (ref 4.0–10.5)
nRBC: 0 % (ref 0.0–0.2)

## 2018-08-29 LAB — COMPREHENSIVE METABOLIC PANEL
ALBUMIN: 3.7 g/dL (ref 3.5–5.0)
ALT: 13 U/L (ref 0–44)
ANION GAP: 8 (ref 5–15)
AST: 17 U/L (ref 15–41)
Alkaline Phosphatase: 40 U/L (ref 38–126)
BILIRUBIN TOTAL: 0.5 mg/dL (ref 0.3–1.2)
BUN: 8 mg/dL (ref 6–20)
CHLORIDE: 105 mmol/L (ref 98–111)
CO2: 26 mmol/L (ref 22–32)
Calcium: 9.6 mg/dL (ref 8.9–10.3)
Creatinine, Ser: 0.79 mg/dL (ref 0.44–1.00)
GFR calc Af Amer: >60 mL/min (ref >60)
Glucose, Bld: 98 mg/dL (ref 70–99)
POTASSIUM: 4.3 mmol/L (ref 3.5–5.1)
Sodium: 139 mmol/L (ref 135–145)
Total Protein: 6.2 g/dL — ABNORMAL LOW (ref 6.5–8.1)

## 2018-08-29 LAB — LIPASE, BLOOD: LIPASE: 61 U/L — AB (ref 11–51)

## 2018-08-29 LAB — I-STAT BETA HCG BLOOD, ED (MC, WL, AP ONLY): I-stat hCG, quantitative: <5 m[IU]/mL (ref <5)

## 2018-08-29 NOTE — Telephone Encounter (Signed)
Patient went to the ED to be seen while the pain was occurring. Unfortunately she was beginning to improve before she was seen.  She says she has discussed this before. She states this pain has decreased in it's frequency since starting Humira and Linzess. She does not feel she is constipated but admits she does not feel she is emptying completely on some days. Weekends she "goes" more often. Any thoughts?  Is she due follow up?

## 2018-08-29 NOTE — ED Provider Notes (Signed)
[facility] EMERGENCY DEPARTMENT Provider Note   CSN: [id number] Arrival date & time: 08/29/18  3888     History   Chief Complaint Chief Complaint  Patient presents with  . Abdominal Pain    HPI Lacey Hess is a 37 y.o. female.  The history is provided by the patient and a significant other.  Abdominal Pain   This is a new problem. The current episode started 6 to 12 hours ago. The problem occurs constantly. The problem has been gradually improving. The pain is located in the RUQ and RLQ. The pain is moderate. Pertinent negatives include fever, vomiting and dysuria. Nothing aggravates the symptoms. Nothing relieves the symptoms. Past workup includes GI consult and CT scan. Her past medical history is significant for Crohn's disease.   Pt with of Crohn's disease presents with abdominal pain.  She reports gradual onset of pain at approximately 7 PM on October 27.  Within the hour became very severe, mostly on her right side. Denies fever/vomiting.  No new change in her bowel habits.  No bloody diarrhea.  No dysuria.  No vaginal discharge. She is well-known to gastroenterology, and has been on a regimen of medications including Humira and budesonide for Crohn's Past Medical History:  Diagnosis Date  . Anxiety   . Crohn's disease ([place])   . IBS (irritable bowel syndrome)     Patient Active Problem List   Diagnosis Date Noted  . Crohn's disease involving terminal ileum ([place]) 05/25/2018  . Other constipation 12/03/2017    History reviewed. No pertinent surgical history.   OB History   None      Home Medications    Prior to Admission medications   Medication Sig Start Date End Date Taking? Authorizing Provider  acetaminophen (TYLENOL) 500 MG tablet Take 500-1,000 mg by mouth every 8 (eight) hours as needed (for pain).    Yes [provider]  Adalimumab (HUMIRA PEN) 40 MG/0.4ML PNKT Inject 1 pen into the skin every 14 (fourteen) days. 06/15/18   Yes Nandigam, Venia Minks, MD  budesonide (ENTOCORT EC) 3 MG 24 hr capsule Take 3 capsules (9 mg total) by mouth daily. 04/08/18  Yes Nandigam, Venia Minks, MD  cyanocobalamin (,VITAMIN B-12,) 1000 MCG/ML injection Inject 1 mL (1,000 mcg total) into the muscle every 30 (thirty) days. Patient taking differently: Inject 1,000 mcg into the muscle every 28 (twenty-eight) days.  04/12/18  Yes Mauri Pole, MD  etonogestrel-ethinyl estradiol (NUVARING) 0.12-0.015 MG/24HR vaginal ring Place 1 each vaginally every 28 (twenty-eight) days. Insert vaginally and leave in place for 3 consecutive weeks, then remove for 1 week.   Yes [provider]  linaclotide Rolan Lipa) 145 MCG CAPS capsule Take 1 capsule (145 mcg total) by mouth daily before breakfast. 05/10/18  Yes Nandigam, Venia Minks, MD    Family History Family History  Problem Relation Age of Onset  . Breast cancer Maternal Grandfather   . Diabetes Maternal Aunt     Social History Social History   Tobacco Use  . Smoking status: Never Smoker  . Smokeless tobacco: Never Used  Substance Use Topics  . Alcohol use: Yes    Frequency: Never  . Drug use: No     Allergies   Adhesive [tape]   Review of Systems Review of Systems  Constitutional: Negative for fever.  Respiratory: Negative for cough.   Gastrointestinal: Positive for abdominal pain. Negative for vomiting.  Genitourinary: Negative for dysuria and vaginal discharge.  All other systems reviewed and  are negative.    Physical Exam Updated Vital Signs BP 104/75 (BP Location: Right Arm)   Pulse 68   Temp 97.9 F (36.6 C) (Oral)   Resp 17   LMP 08/28/2018   SpO2 100%   Physical Exam  CONSTITUTIONAL: Well developed/well nourished HEAD: Normocephalic/atraumatic EYES: EOMI/PERRL, no icterus ENMT: Mucous membranes moist NECK: supple no meningeal signs SPINE/BACK:entire spine nontender CV: S1/S2 noted, no murmurs/rubs/gallops noted LUNGS: Lungs are clear to  auscultation bilaterally, no apparent distress ABDOMEN: soft, mild RUQ/RLQ tenderness, no rebound or guarding, bowel sounds noted throughout abdomen GU:no cva tenderness NEURO: Pt is awake/alert/appropriate, moves all extremitiesx4.  No facial droop.   EXTREMITIES: pulses normal/equal, full ROM SKIN: warm, color normal PSYCH: no abnormalities of mood noted, alert and oriented to situation  ED Treatments / Results  Labs (all labs ordered are listed, but only abnormal results are displayed) Labs Reviewed  LIPASE, BLOOD - Abnormal; Notable for the following components:      Result Value   Lipase 61 (*)    All other components within normal limits  COMPREHENSIVE METABOLIC PANEL - Abnormal; Notable for the following components:   Total Protein 6.2 (*)    All other components within normal limits  URINALYSIS, ROUTINE W REFLEX MICROSCOPIC - Abnormal; Notable for the following components:   APPearance HAZY (*)    Specific Gravity, Urine 1.031 (*)    Hgb urine dipstick MODERATE (*)    All other components within normal limits  CBC  I-STAT BETA HCG BLOOD, ED ([facility], [facility], [facility] ONLY)    EKG None  Radiology No results found.  Procedures Procedures   Medications Ordered in ED Medications - No data to display   Initial Impression / Assessment and Plan / ED Course  I have reviewed the triage vital signs and the nursing notes.  Pertinent labs results that were available during my care of the patient were reviewed by me and considered in my medical decision making (see chart for details).     Patient presents with abdominal pain that is improving.  She reports this episode is similar to prior episodes of pain with Crohn's, but has not had many since starting Humira earlier this year.  She reports the episodes are typically very intense but will resolve after several hours.  She had no associated fever or vomiting.  Her labs are reassuring.  I gave her multiple options including pain control,  monitoring and if worsening we could do imaging.  She declined all the above and was requesting discharge.  She plans to call her gastroenterologist today for further follow-up.  We discussed strict ER return precautions  Final Clinical Impressions(s) / ED Diagnoses   Final diagnoses:  Generalized abdominal pain    ED Discharge Orders    None       Ripley Fraise, MD 08/29/18 [phone]

## 2018-08-29 NOTE — ED Triage Notes (Signed)
C/o R sided abd pain since 7pm.  Denies nausea, vomiting, diarrhea, and urinary complaints.  Diagnosed with crohn's disease approx 1 year ago and believes this may be a flare-up.

## 2018-08-29 NOTE — ED Notes (Signed)
Patient verbalizes understanding of medications and discharge instructions. No further questions at this time. VSS and patient ambulatory at discharge.   

## 2018-08-29 NOTE — Telephone Encounter (Signed)
Left message to call back  

## 2018-08-29 NOTE — Telephone Encounter (Signed)
Ok, please bring her in for office visit.

## 2018-08-29 NOTE — Telephone Encounter (Signed)
Pt's went to Ed last night at [facility] due to abd pain, she would like a call back to update you on that.

## 2018-08-30 NOTE — Telephone Encounter (Signed)
Patient contacted. She accepts an appointment for 08/31/18 at 8:45 am.

## 2018-08-31 ENCOUNTER — Encounter: Payer: Self-pay | Admitting: Gastroenterology

## 2018-08-31 ENCOUNTER — Ambulatory Visit: Payer: BC Managed Care – PPO | Admitting: Gastroenterology

## 2018-08-31 ENCOUNTER — Ambulatory Visit (INDEPENDENT_AMBULATORY_CARE_PROVIDER_SITE_OTHER)
Admission: RE | Admit: 2018-08-31 | Discharge: 2018-08-31 | Disposition: A | Discharge status: Home / Self Care | Payer: BC Managed Care – PPO | Source: Ambulatory Visit | Attending: Gastroenterology | Admitting: Gastroenterology

## 2018-08-31 ENCOUNTER — Other Ambulatory Visit (INDEPENDENT_AMBULATORY_CARE_PROVIDER_SITE_OTHER): Payer: BC Managed Care – PPO

## 2018-08-31 VITALS — BP 90/64 | HR 52 | Ht 65.75 in | Wt 163.8 lb

## 2018-08-31 DIAGNOSIS — R1031 Right lower quadrant pain: Secondary | ICD-10-CM | POA: Diagnosis not present

## 2018-08-31 DIAGNOSIS — K50019 Crohn's disease of small intestine with unspecified complications: Secondary | ICD-10-CM

## 2018-08-31 DIAGNOSIS — K5900 Constipation, unspecified: Secondary | ICD-10-CM

## 2018-08-31 LAB — SEDIMENTATION RATE: Sed Rate: 5 mm/hr (ref 0–20)

## 2018-08-31 LAB — HEPATIC FUNCTION PANEL
ALK PHOS: 42 U/L (ref 39–117)
ALT: 12 U/L (ref 0–35)
AST: 12 U/L (ref 0–37)
Albumin: 4.5 g/dL (ref 3.5–5.2)
BILIRUBIN DIRECT: 0.1 mg/dL (ref 0.0–0.3)
BILIRUBIN TOTAL: 0.6 mg/dL (ref 0.2–1.2)
TOTAL PROTEIN: 7.3 g/dL (ref 6.0–8.3)

## 2018-08-31 LAB — C-REACTIVE PROTEIN: CRP: 0.3 mg/dL — ABNORMAL LOW (ref 0.5–20.0)

## 2018-08-31 LAB — LIPASE: Lipase: 50 U/L (ref 11.0–59.0)

## 2018-08-31 MED ORDER — LINACLOTIDE 290 MCG PO CAPS: 290.0000 ug | ORAL_CAPSULE | Freq: Every day | ORAL | 0 refills | Status: DC

## 2018-08-31 NOTE — Progress Notes (Signed)
Lacey Hess    [id number]    04-Nov-1980  Primary Care Physician:Bailey, Lavella Lemons, CNM  Referring Physician: Artelia Laroche, [place], [place] [address]  Chief complaint:  Crohn's disease, abdominal pain  HPI:  37 yr F with h/o Crohn's disease TI involvement here for follow up visit with c/o abdominal pain.  She presented to ER on 08/29/18 with significant RLQ abdominal pain. She couldn't touch, was tender when she pushed on her belly. She had similar pain about twice in the past 2 months. BM are irregular and is having bowel movement every 2 days and sometimes she can go for about 4 days without BM.  Denies any correlation between abdominal pain and when she does not have a bowel movement for prolonged period of time. No fever, chills, nausea, vomiting or blood in stool . Lipase slightly elevated at 61. LFT, BMP and CBC within normal limits.  She did not have any imaging in the ER. She is on maintenance dose of Humira and also budesonide 9 mg daily.  She was initiated on Humira in April 2019.   Outpatient Encounter Medications as of 08/31/2018  Medication Sig  . acetaminophen (TYLENOL) 500 MG tablet Take 500-1,000 mg by mouth every 8 (eight) hours as needed (for pain).   . Adalimumab (HUMIRA PEN) 40 MG/0.4ML PNKT Inject 1 pen into the skin every 14 (fourteen) days.  . budesonide (ENTOCORT EC) 3 MG 24 hr capsule Take 3 capsules (9 mg total) by mouth daily.  . cyanocobalamin (,VITAMIN B-12,) 1000 MCG/ML injection Inject 1 mL (1,000 mcg total) into the muscle every 30 (thirty) days. (Patient taking differently: Inject 1,000 mcg into the muscle every 28 (twenty-eight) days. )  . etonogestrel-ethinyl estradiol (NUVARING) 0.12-0.015 MG/24HR vaginal ring Place 1 each vaginally every 28 (twenty-eight) days. Insert vaginally and leave in place for 3 consecutive weeks, then remove for 1 week.  . linaclotide (LINZESS) 145 MCG CAPS capsule Take 1 capsule (145 mcg total)  by mouth daily before breakfast.   Facility-Administered Encounter Medications as of 08/31/2018  Medication  . 0.9 %  sodium chloride infusion    Allergies as of 08/31/2018 - Review Complete 08/29/2018  Allergen Reaction Noted  . Adhesive [tape] Rash 08/29/2018    Past Medical History:  Diagnosis Date  . Anxiety   . Crohn's disease ([place])   . IBS (irritable bowel syndrome)     No past surgical history on file.  Family History  Problem Relation Age of Onset  . Breast cancer Maternal Grandfather   . Diabetes Maternal Aunt     Social History   Socioeconomic History  . Marital status: Married    Spouse name: Not on file  . Number of children: Not on file  . Years of education: Not on file  . Highest education level: Not on file  Occupational History  . Not on file  Social Needs  . Financial resource strain: Not on file  . Food insecurity:    Worry: Not on file    Inability: Not on file  . Transportation needs:    Medical: Not on file    Non-medical: Not on file  Tobacco Use  . Smoking status: Never Smoker  . Smokeless tobacco: Never Used  Substance and Sexual Activity  . Alcohol use: Yes    Frequency: Never  . Drug use: No  . Sexual activity: Not on file  Lifestyle  . Physical activity:  Days per week: Not on file    Minutes per session: Not on file  . Stress: Not on file  Relationships  . Social connections:    Talks on phone: Not on file    Gets together: Not on file    Attends religious service: Not on file    Active member of club or organization: Not on file    Attends meetings of clubs or organizations: Not on file    Relationship status: Not on file  . Intimate partner violence:    Fear of current or ex partner: Not on file    Emotionally abused: Not on file    Physically abused: Not on file    Forced sexual activity: Not on file  Other Topics Concern  . Not on file  Social History Narrative  . Not on file      Review of  systems: Review of Systems  Constitutional: Negative for fever and chills.  HENT: Negative.   Eyes: Negative for blurred vision.  Respiratory: Negative for cough, shortness of breath and wheezing.   Cardiovascular: Negative for chest pain and palpitations.  Gastrointestinal: as per HPI Genitourinary: Negative for dysuria, urgency, frequency and hematuria.  Musculoskeletal: Negative for myalgias, back pain and joint pain.  Skin: Negative for itching and rash.  Neurological: Negative for dizziness, tremors, focal weakness, seizures and loss of consciousness.  Endo/Heme/Allergies: Negative for seasonal allergies.  Psychiatric/Behavioral: Negative for depression, suicidal ideas and hallucinations.  All other systems reviewed and are negative.   Physical Exam: Vitals:   08/31/18 0849  BP: 90/64  Pulse: (!) 52   Body mass index is 26.64 kg/m. Gen:      No acute distress HEENT:  EOMI, sclera anicteric Neck:     No masses; no thyromegaly Lungs:    Clear to auscultation bilaterally; normal respiratory effort CV:         Regular rate and rhythm; no murmurs Abd:      + bowel sounds; soft, non-tender; no palpable masses, no distension Ext:    No edema; adequate peripheral perfusion Skin:      Warm and dry; no rash Neuro: alert and oriented x 3 Psych: normal mood and affect  Data Reviewed:  Reviewed labs, radiology imaging, old records and pertinent past GI work up   Assessment and Plan/Recommendations:  37 year old female with history of Crohn's disease with involvement of terminal ileum with complaints of worsening right-sided abdominal pain We will check CRP to monitor degree of inflammation and exclude acute flare of Crohn's disease Continue Humira and budesonide Will check Humira trough and antibody level Obtain abdominal x-ray to evaluate for free stool burden given worsening constipation and?  Partial bowel obstruction or small bowel distention.  If has evidence of increased  stool burden we will do a bowel purge with MiraLAX and Gatorade Increase Linzess to 290 mcg daily after the bowel purge    Greater than 50% of the time used for counseling as well as treatment plan and follow-up. She had multiple questions which were answered to her satisfaction  K. Denzil Magnuson , MD [phone]    CC: Artelia Laroche, CNM

## 2018-08-31 NOTE — Patient Instructions (Signed)
Your provider has requested that you go to the basement level for lab work before leaving today. Press "B" on the elevator. The lab is located at the first door on the left as you exit the elevator.  After your lab work please stay in the basement and go directly to x-ray to get abdominal film.   If your abdominal x-ray shows stool burden then please do bowel purge. Purchase a bottle of Miralax over the counter as well as a box of 5 mg dulcolax tablets. Take 4 dulcolax tablets. Wait 1 hour. You will then drink 6-8 capfuls of Miralax mixed in an adequate amount of water/juice/gatorade (you may choose which of these liquids to drink) over the next 2-3 hours. You should expect results within 1 to 6 hours after completing the bowel purge.  Start Linzess 290 mcg samples taking one capsule by mouth daily.   Keep your appointment in December.

## 2018-09-06 ENCOUNTER — Other Ambulatory Visit: Payer: BC Managed Care – PPO

## 2018-09-06 DIAGNOSIS — K633 Ulcer of intestine: Secondary | ICD-10-CM

## 2018-09-06 DIAGNOSIS — K50019 Crohn's disease of small intestine with unspecified complications: Secondary | ICD-10-CM

## 2018-09-06 DIAGNOSIS — R109 Unspecified abdominal pain: Secondary | ICD-10-CM

## 2018-09-08 LAB — QUANTIFERON-TB GOLD PLUS
Mitogen-NIL: >10 IU/mL
NIL: 0.1 IU/mL
QuantiFERON-TB Gold Plus: NEGATIVE
TB2-NIL: 0 [IU]/mL

## 2018-09-09 ENCOUNTER — Telehealth: Payer: Self-pay | Admitting: Gastroenterology

## 2018-09-09 ENCOUNTER — Ambulatory Visit: Payer: BC Managed Care – PPO | Admitting: Medical

## 2018-09-09 ENCOUNTER — Encounter: Payer: Self-pay | Admitting: Medical

## 2018-09-09 VITALS — BP 110/68 | HR 60 | Temp 98.0°F | Resp 16 | Ht 66.5 in | Wt 163.8 lb

## 2018-09-09 DIAGNOSIS — Z79899 Other long term (current) drug therapy: Secondary | ICD-10-CM

## 2018-09-09 DIAGNOSIS — K50919 Crohn's disease, unspecified, with unspecified complications: Secondary | ICD-10-CM

## 2018-09-09 DIAGNOSIS — J0100 Acute maxillary sinusitis, unspecified: Secondary | ICD-10-CM

## 2018-09-09 MED ORDER — AMOXICILLIN 875 MG PO TABS: 875.0000 mg | ORAL_TABLET | Freq: Two times a day (BID) | ORAL | 0 refills | Status: DC

## 2018-09-09 NOTE — Telephone Encounter (Signed)
Please check the date she will be due for Humira injection as I may recommend her to hold it for few days based on when she will be due ?Marland Kitchen  Please advise her to continue the antibiotics, she was given a 10-day course of Amoxicillin.

## 2018-09-09 NOTE — Telephone Encounter (Signed)
Ok to proceed with Humira injection as scheduled on 11/18. Thanks

## 2018-09-09 NOTE — Telephone Encounter (Signed)
Dr Silverio Decamp she is due on 11/18 for next injection

## 2018-09-09 NOTE — Telephone Encounter (Signed)
Left message on machine to call back  

## 2018-09-09 NOTE — Progress Notes (Signed)
Subjective: Chief Complaint  Patient presents with  . cold    itchy throat, cough, congestion, headache  X 1 week    Here as a new patient today.  She reports 6 day hx/o sore throat, having coughing fits in the night, headache, body aches, head congestion, runny nose, sore throat, sneezing.  Feels congestion in chest and head.  No fever.  No nausea or vomiting.  Has had some sick contacts with similar.   Using Robitussin DM, Theraflu, cough drops, for symptoms.  Nonsmoker.  May get illness like this once yearly but this is more persistent.     Been on Humira since 03/2018 for Crohns, diagnosed 12/2017, sees Dr. Silverio Decamp at [place].  No other aggravating or relieving factors. No other complaint.  Past Medical History:  Diagnosis Date  . Anxiety   . Crohn's disease ([place])   . IBS (irritable bowel syndrome)     Current Outpatient Medications on File Prior to Visit  Medication Sig Dispense Refill  . acetaminophen (TYLENOL) 500 MG tablet Take 500-1,000 mg by mouth every 8 (eight) hours as needed (for pain).     . Adalimumab (HUMIRA PEN) 40 MG/0.4ML PNKT Inject 1 pen into the skin every 14 (fourteen) days. 2 each 6  . budesonide (ENTOCORT EC) 3 MG 24 hr capsule Take 3 capsules (9 mg total) by mouth daily. 90 capsule 3  . cyanocobalamin (,VITAMIN B-12,) 1000 MCG/ML injection Inject 1 mL (1,000 mcg total) into the muscle every 30 (thirty) days. (Patient taking differently: Inject 1,000 mcg into the muscle every 28 (twenty-eight) days. ) 1 mL 12  . etonogestrel-ethinyl estradiol (NUVARING) 0.12-0.015 MG/24HR vaginal ring Place 1 each vaginally every 28 (twenty-eight) days. Insert vaginally and leave in place for 3 consecutive weeks, then remove for 1 week.    . linaclotide (LINZESS) 290 MCG CAPS capsule Take 1 capsule (290 mcg total) by mouth daily before breakfast. 16 capsule 0   Current Facility-Administered Medications on File Prior to Visit  Medication Dose Route Frequency Provider Last Rate  Last Dose  . 0.9 %  sodium chloride infusion  500 mL Intravenous Continuous Nandigam, Kavitha V, MD       ROS as in subjective    Objective: BP 110/68   Pulse 60   Temp 98 F (36.7 C) (Oral)   Resp 16   Ht 5' 6.5" (1.689 m)   Wt 163 lb 12.8 oz (74.3 kg)   LMP 08/28/2018   SpO2 98%   BMI 26.04 kg/m   General appearance: alert, no distress, WD/WN, white female HEENT: normocephalic, sclerae anicteric, maxillary sinus tenderness, TMs flat, nares patent but with mucoid discharge and erythema, pharynx with mild erythema Oral cavity: MMM, no lesions Neck: supple, no lymphadenopathy, no thyromegaly, no masses Lungs: CTA bilaterally, no wheezes, rhonchi, or rales    Assessment: Encounter Diagnoses  Name Primary?  . Acute non-recurrent maxillary sinusitis Yes  . High risk medication use   . Crohn's disease with complication, unspecified gastrointestinal tract location [facility])      Plan:  We discussed symptoms and exam findings, begin amoxicillin as below, hydrate well, can continue Robitussin-DM the next few days, rest, and if much worse over the weekend or not improving, call after-hours line or go to the emergency department if spiking a fever and feeling way worse  Crohns disease managed by GI, and she was advised to contact us or GI in the future if feverish or illness in light of Humira risks.   Lacey Hess  was seen today for cold.  Diagnoses and all orders for this visit:  Acute non-recurrent maxillary sinusitis  High risk medication use  Crohn's disease with complication, unspecified gastrointestinal tract location [facility])  Other orders -     amoxicillin (AMOXIL) 875 MG tablet; Take 1 tablet (875 mg total) by mouth 2 (two) times daily.

## 2018-09-09 NOTE — Telephone Encounter (Signed)
The patient has been notified of this information and all questions answered. The pt has been advised of the information and verbalized understanding.    

## 2018-09-09 NOTE — Telephone Encounter (Signed)
Dr Silverio Decamp this pt has a sinus infection and will be given abx by her PCP.  She is using Humira.  Last dose was a week ago.  She wants to be sure this is ok and let you know she has the sinus infection.

## 2018-09-12 LAB — SERIAL MONITORING

## 2018-09-12 LAB — ADALIMUMAB+AB (SERIAL MONITOR)
ADALIMUMAB DRUG LEVEL: 16 ug/mL
Anti-Adalimumab Antibody: 28 ng/mL

## 2018-10-03 ENCOUNTER — Telehealth: Payer: Self-pay | Admitting: Gastroenterology

## 2018-10-03 NOTE — Telephone Encounter (Signed)
And wants to know if she can cancel her appt for Wednesday

## 2018-10-03 NOTE — Telephone Encounter (Signed)
Left message to call and discuss.

## 2018-10-04 ENCOUNTER — Other Ambulatory Visit: Payer: Self-pay | Admitting: Gastroenterology

## 2018-10-04 DIAGNOSIS — R1030 Lower abdominal pain, unspecified: Secondary | ICD-10-CM

## 2018-10-04 NOTE — Progress Notes (Signed)
Ok please request drug trough and Ab level. Will need to check the level prior to she does the injection. Thanks

## 2018-10-04 NOTE — Telephone Encounter (Signed)
Patient is doing "much better" since purging her bowels. She is taking daily Linzess and feels she is getting benefit from the Linzess 145 mcg. She does not have anymore Linzess 290 mcg. Moved her appointment to January.

## 2018-10-05 ENCOUNTER — Ambulatory Visit: Payer: BC Managed Care – PPO | Admitting: Gastroenterology

## 2018-10-07 ENCOUNTER — Telehealth: Payer: Self-pay | Admitting: Gastroenterology

## 2018-10-07 DIAGNOSIS — R1030 Lower abdominal pain, unspecified: Secondary | ICD-10-CM

## 2018-10-07 MED ORDER — BUDESONIDE 3 MG PO CPEP: ORAL_CAPSULE | ORAL | 2 refills | Status: DC

## 2018-10-07 MED ORDER — LINACLOTIDE 290 MCG PO CAPS: 290.0000 ug | ORAL_CAPSULE | Freq: Every day | ORAL | 0 refills | Status: DC

## 2018-10-07 NOTE — Telephone Encounter (Signed)
meds sent as requested by patient electronically to her pharmacy

## 2018-11-09 ENCOUNTER — Other Ambulatory Visit: Payer: Self-pay | Admitting: Gastroenterology

## 2018-11-29 ENCOUNTER — Encounter: Payer: Self-pay | Admitting: Family Medicine

## 2018-11-29 ENCOUNTER — Ambulatory Visit: Payer: BC Managed Care – PPO | Admitting: Family Medicine

## 2018-11-29 VITALS — BP 112/78 | HR 60 | Temp 98.4°F | Wt 166.2 lb

## 2018-11-29 DIAGNOSIS — M791 Myalgia, unspecified site: Secondary | ICD-10-CM | POA: Diagnosis not present

## 2018-11-29 DIAGNOSIS — K50919 Crohn's disease, unspecified, with unspecified complications: Secondary | ICD-10-CM | POA: Diagnosis not present

## 2018-11-29 DIAGNOSIS — R6889 Other general symptoms and signs: Secondary | ICD-10-CM

## 2018-11-29 LAB — POCT INFLUENZA A/B
INFLUENZA A, POC: NEGATIVE
Influenza B, POC: NEGATIVE

## 2018-11-29 NOTE — Progress Notes (Signed)
   Subjective:    Patient ID: Lacey Hess, female    DOB: [birth year], 38 y.o.   MRN: [id number]  HPI She complains of a 1 day history of headache, chest discomfort, intermittent cough with sore throat but no earache, fever, chills or myalgias.  She did however say that she felt hot last night but did not check her temperature.  She does have a history of Crohn's disease and presently is on Humira.   Review of Systems     Objective:   Physical Exam Alert and in no distress. Tympanic membranes and canals are normal. Pharyngeal area is normal. Neck is supple without adenopathy or thyromegaly. Cardiac exam shows a regular sinus rhythm without murmurs or gallops. Lungs are clear to auscultation. Flu test is negative      Assessment & Plan:  Myalgia - Plan: Influenza A/B  Crohn's disease with complication, unspecified gastrointestinal tract location [facility]) I explained that I think she does have a viral illness however the best way to treat this is symptomatic care.  If her symptoms get worse especially since she has Crohn's, she is to call for another appointment.

## 2018-12-02 ENCOUNTER — Encounter: Payer: Self-pay | Admitting: Gastroenterology

## 2018-12-02 ENCOUNTER — Ambulatory Visit: Payer: BC Managed Care – PPO | Admitting: Gastroenterology

## 2018-12-02 VITALS — BP 90/60 | HR 66 | Ht 65.5 in | Wt 164.0 lb

## 2018-12-02 DIAGNOSIS — Z79899 Other long term (current) drug therapy: Secondary | ICD-10-CM | POA: Diagnosis not present

## 2018-12-02 DIAGNOSIS — K5000 Crohn's disease of small intestine without complications: Secondary | ICD-10-CM

## 2018-12-02 DIAGNOSIS — R1031 Right lower quadrant pain: Secondary | ICD-10-CM

## 2018-12-02 DIAGNOSIS — E538 Deficiency of other specified B group vitamins: Secondary | ICD-10-CM

## 2018-12-02 DIAGNOSIS — K5900 Constipation, unspecified: Secondary | ICD-10-CM

## 2018-12-02 DIAGNOSIS — Z79620 Long term (current) use of immunosuppressive biologic: Secondary | ICD-10-CM

## 2018-12-02 NOTE — Patient Instructions (Addendum)
Go to the basement for labs on Monday 12/12/2018  Continue Current medications   Follow up in 3 months   If you are age 36 or older, your body mass index should be between 23-30. Your Body mass index is 26.88 kg/m. If this is out of the aforementioned range listed, please consider follow up with your Primary Care Provider.  If you are age 30 or younger, your body mass index should be between 19-25. Your Body mass index is 26.88 kg/m. If this is out of the aformentioned range listed, please consider follow up with your Primary Care Provider.    I appreciate the  opportunity to care for you  Thank You   Harl Bowie , MD

## 2018-12-02 NOTE — Progress Notes (Signed)
Lacey Hess    [id number]    [birth year]  Primary Care Physician:Tysinger, Camelia Eng, PA-C  Referring Physician: Carlena Hurl, PA-C [address] [place], [place] [address]  Chief complaint: Crohn's disease  HPI: 38 year old female with history of Crohn's disease predominantly in terminal and distal ileum.  On Humira biweekly injection.  She is also taking budesonide 9 mg daily.  Humira antibody level detected at 28 and drug level 16 on September 06, 2018.  Linzess 145 mcg daily is helping and her bowel movements are regular, no longer constipated.  She continues to have intermittent sharp right lower quadrant abdominal pain, worse when she eats high-fiber diet with salads.  Denies any blood in stool or dark stool.  No nausea, vomiting, loss of appetite or weight loss.  Colonoscopy December 06, 2017 showed multiple aphthous ulcers in terminal ileum, biopsy consistent with erosions and active inflammation. CT enterography April 2019 with contrast showed mild wall thickening and abnormal mucosal enhancement in terminal ileum highly suspicious for Crohn's disease.  Outpatient Encounter Medications as of 12/02/2018  Medication Sig  . acetaminophen (TYLENOL) 500 MG tablet Take 500-1,000 mg by mouth every 8 (eight) hours as needed (for pain).   . Adalimumab (HUMIRA PEN) 40 MG/0.4ML PNKT Inject 1 pen into the skin every 14 (fourteen) days.  . budesonide (ENTOCORT EC) 3 MG 24 hr capsule Take 3 capsules (9 mg total) by mouth daily.  . cyanocobalamin (,VITAMIN B-12,) 1000 MCG/ML injection Inject 1 mL (1,000 mcg total) into the muscle every 30 (thirty) days. (Patient taking differently: Inject 1,000 mcg into the muscle every 28 (twenty-eight) days. )  . etonogestrel-ethinyl estradiol (NUVARING) 0.12-0.015 MG/24HR vaginal ring Place 1 each vaginally every 28 (twenty-eight) days. Insert vaginally and leave in place for 3 consecutive weeks, then remove for 1 week.  . linaclotide (LINZESS)  145 MCG CAPS capsule Take 145 mcg by mouth daily before breakfast.  . [DISCONTINUED] LINZESS 145 MCG CAPS capsule TAKE 1 CAPSULE(145 MCG) BY MOUTH DAILY BEFORE BREAKFAST  . [DISCONTINUED] amoxicillin (AMOXIL) 875 MG tablet Take 1 tablet (875 mg total) by mouth 2 (two) times daily. (Patient not taking: Reported on 11/29/2018)  . [DISCONTINUED] budesonide (ENTOCORT EC) 3 MG 24 hr capsule TAKE 3 CAPSULES(9 MG) BY MOUTH DAILY (Patient not taking: Reported on 11/29/2018)  . [DISCONTINUED] linaclotide (LINZESS) 290 MCG CAPS capsule Take 1 capsule (290 mcg total) by mouth daily before breakfast. (Patient not taking: Reported on 11/29/2018)  . [DISCONTINUED] 0.9 %  sodium chloride infusion    No facility-administered encounter medications on file as of 12/02/2018.     Allergies as of 12/02/2018 - Review Complete 12/02/2018  Allergen Reaction Noted  . Adhesive [tape] Rash 08/29/2018    Past Medical History:  Diagnosis Date  . Anxiety   . Crohn's disease ([place])   . IBS (irritable bowel syndrome)     Past Surgical History:  Procedure Laterality Date  . NO PAST SURGERIES      Family History  Problem Relation Age of Onset  . Diabetes Maternal Aunt   . Breast cancer Maternal Grandmother   . Colon cancer Neg Hx   . Esophageal cancer Neg Hx   . Rectal cancer Neg Hx     Social History   Socioeconomic History  . Marital status: Married    Spouse name: Not on file  . Number of children: 0  . Years of education: Not on file  . Highest education  level: Not on file  Occupational History  . Occupation: [employer]  Social Needs  . Financial resource strain: Not on file  . Food insecurity:    Worry: Not on file    Inability: Not on file  . Transportation needs:    Medical: Not on file    Non-medical: Not on file  Tobacco Use  . Smoking status: Never Smoker  . Smokeless tobacco: Never Used  Substance and Sexual Activity  . Alcohol use: Yes    Frequency: Never  . Drug use: No  . Sexual  activity: Not on file  Lifestyle  . Physical activity:    Days per week: Not on file    Minutes per session: Not on file  . Stress: Not on file  Relationships  . Social connections:    Talks on phone: Not on file    Gets together: Not on file    Attends religious service: Not on file    Active member of club or organization: Not on file    Attends meetings of clubs or organizations: Not on file    Relationship status: Not on file  . Intimate partner violence:    Fear of current or ex partner: Not on file    Emotionally abused: Not on file    Physically abused: Not on file    Forced sexual activity: Not on file  Other Topics Concern  . Not on file  Social History Narrative  . Not on file      Review of systems: Review of Systems  Constitutional: Negative for fever and chills.  HENT: Positive for sinus problem Eyes: Negative for blurred vision.  Respiratory: Negative for cough, shortness of breath and wheezing.   Cardiovascular: Negative for chest pain and palpitations.  Gastrointestinal: as per HPI Genitourinary: Negative for dysuria, urgency, frequency and hematuria.  Musculoskeletal: Negative for myalgias, back pain and joint pain.  Skin: Negative for itching and rash.  Neurological: Negative for dizziness, tremors, focal weakness, seizures and loss of consciousness.  Endo/Heme/Allergies: Positive for seasonal allergies.  Psychiatric/Behavioral: Negative for depression, suicidal ideas and hallucinations.  All other systems reviewed and are negative.   Physical Exam: Vitals:   12/02/18 0851  BP: 90/60  Pulse: 66   Body mass index is 26.88 kg/m. Gen:      No acute distress HEENT:  EOMI, sclera anicteric Neck:     No masses; no thyromegaly Lungs:    Clear to auscultation bilaterally; normal respiratory effort CV:         Regular rate and rhythm; no murmurs Abd:      + bowel sounds; soft, non-tender; no palpable masses, no distension Ext:    No edema; adequate  peripheral perfusion Skin:      Warm and dry; no rash Neuro: alert and oriented x 3 Psych: normal mood and affect  Data Reviewed:  Reviewed labs, radiology imaging, old records and pertinent past GI work up   Assessment and Plan/Recommendations:  39 year old female with Crohn's disease diagnosed in 2019, predominantly involving terminal and distal ileum, initiated on Humira April 2019 She continues to have intermittent right lower quadrant abdominal pain Will recheck Humira drug trough and antibody level.  She had detectable antibody to Humira though low level.  Will need to continue to monitor. Continue budesonide 9 mg daily B12 deficiency: Continue monthly B12 injections Check CBC, CMP, CRP, Q25, folic acid, ferritin, TB QuantiFERON and vitamin D level Constipation improved with Linzess 145 mcg daily, continue Return in  3 months or sooner if needed  Greater than 50% of the time used for counseling as well as treatment plan and follow-up. She had multiple questions which were answered to her satisfaction  K. Denzil Magnuson , MD [phone]    CC: Tysinger, Camelia Eng, PA-C

## 2018-12-12 ENCOUNTER — Other Ambulatory Visit (INDEPENDENT_AMBULATORY_CARE_PROVIDER_SITE_OTHER): Payer: BC Managed Care – PPO

## 2018-12-12 DIAGNOSIS — K5000 Crohn's disease of small intestine without complications: Secondary | ICD-10-CM | POA: Diagnosis not present

## 2018-12-12 LAB — COMPREHENSIVE METABOLIC PANEL
ALT: 12 U/L (ref 0–35)
AST: 15 U/L (ref 0–37)
Albumin: 4.2 g/dL (ref 3.5–5.2)
Alkaline Phosphatase: 39 U/L (ref 39–117)
BUN: 13 mg/dL (ref 6–23)
CO2: 26 mEq/L (ref 19–32)
Calcium: 9.4 mg/dL (ref 8.4–10.5)
Chloride: 104 mEq/L (ref 96–112)
Creatinine, Ser: 0.78 mg/dL (ref 0.40–1.20)
GFR: 83.74 mL/min (ref >60.00)
GLUCOSE: 76 mg/dL (ref 70–99)
Potassium: 3.6 mEq/L (ref 3.5–5.1)
Sodium: 139 mEq/L (ref 135–145)
Total Bilirubin: 0.5 mg/dL (ref 0.2–1.2)
Total Protein: 6.8 g/dL (ref 6.0–8.3)

## 2018-12-12 LAB — CBC WITH DIFFERENTIAL/PLATELET
Basophils Absolute: 0 10*3/uL (ref 0.0–0.1)
Basophils Relative: 0.5 % (ref 0.0–3.0)
EOS ABS: 0 10*3/uL (ref 0.0–0.7)
Eosinophils Relative: 0.4 % (ref 0.0–5.0)
HCT: 39.4 % (ref 36.0–46.0)
Hemoglobin: 12.9 g/dL (ref 12.0–15.0)
Lymphocytes Relative: 49.8 % — ABNORMAL HIGH (ref 12.0–46.0)
Lymphs Abs: 2.7 10*3/uL (ref 0.7–4.0)
MCHC: 32.7 g/dL (ref 30.0–36.0)
MCV: 93.9 fl (ref 78.0–100.0)
Monocytes Absolute: 0.4 10*3/uL (ref 0.1–1.0)
Monocytes Relative: 6.8 % (ref 3.0–12.0)
NEUTROS PCT: 42.5 % — AB (ref 43.0–77.0)
Neutro Abs: 2.3 10*3/uL (ref 1.4–7.7)
Platelets: 226 10*3/uL (ref 150.0–400.0)
RBC: 4.19 Mil/uL (ref 3.87–5.11)
RDW: 13.2 % (ref 11.5–15.5)
WBC: 5.4 10*3/uL (ref 4.0–10.5)

## 2018-12-12 LAB — HIGH SENSITIVITY CRP: CRP HIGH SENSITIVITY: 1.22 mg/L (ref 0.000–5.000)

## 2018-12-12 LAB — FERRITIN: Ferritin: 16.5 ng/mL (ref 10.0–291.0)

## 2018-12-12 LAB — VITAMIN B12: Vitamin B-12: 311 pg/mL (ref 211–911)

## 2018-12-12 LAB — FOLATE: Folate: >24 ng/mL (ref >5.9)

## 2018-12-15 LAB — VITAMIN D 1,25 DIHYDROXY
Vitamin D 1, 25 (OH)2 Total: 65 pg/mL (ref 18–72)
Vitamin D2 1, 25 (OH)2: <8 pg/mL
Vitamin D3 1, 25 (OH)2: 65 pg/mL

## 2018-12-17 ENCOUNTER — Other Ambulatory Visit: Payer: Self-pay | Admitting: Gastroenterology

## 2018-12-20 LAB — ADALIMUMAB+AB (SERIAL MONITOR)
Adalimumab Drug Level: 15 ug/mL
Anti-Adalimumab Antibody: <25 ng/mL

## 2018-12-20 LAB — SERIAL MONITORING

## 2018-12-21 ENCOUNTER — Telehealth: Payer: Self-pay | Admitting: Gastroenterology

## 2018-12-21 NOTE — Telephone Encounter (Signed)
Pt wanting to speak with nurse to let nurse no some of her crohns symptoms that she is having now. She advised that Dr. Silverio Decamp informed her to call the offc and speak with her when the symptoms started up.

## 2018-12-22 NOTE — Telephone Encounter (Signed)
Left message to call back to discuss.

## 2019-01-16 ENCOUNTER — Other Ambulatory Visit: Payer: Self-pay

## 2019-03-20 ENCOUNTER — Other Ambulatory Visit: Payer: Self-pay | Admitting: Gastroenterology

## 2019-05-01 ENCOUNTER — Other Ambulatory Visit: Payer: Self-pay | Admitting: Gastroenterology

## 2019-06-08 ENCOUNTER — Telehealth: Payer: Self-pay

## 2019-06-08 NOTE — Telephone Encounter (Signed)
Received the following message from patient.    Sent:06/07/2019  1:32 PM EDT        PB:[id number] Nandigam, MD   Subject:Non-Urgent Medical Question  Hi Dr. Silverio Decamp,  I hope you are well. Can you provide a letter from you/your office documenting my Crohn's diagnosis and Humira regimen as it relates to COVID-19 and/or my potential increased risk for illness?   Thank you! Lacey Hess

## 2019-06-13 ENCOUNTER — Other Ambulatory Visit: Payer: Self-pay

## 2019-06-13 NOTE — Telephone Encounter (Signed)
Agree. Lacey Hess, can you please send her the letter. Template work not in epic for covid-19 related risks. Thanks

## 2019-06-16 ENCOUNTER — Other Ambulatory Visit: Payer: Self-pay | Admitting: Gastroenterology

## 2019-07-27 ENCOUNTER — Ambulatory Visit: Payer: BC Managed Care – PPO | Admitting: Gastroenterology

## 2019-07-27 ENCOUNTER — Other Ambulatory Visit: Payer: Self-pay

## 2019-07-27 ENCOUNTER — Telehealth: Payer: Self-pay | Admitting: Gastroenterology

## 2019-07-27 ENCOUNTER — Encounter: Payer: Self-pay | Admitting: Gastroenterology

## 2019-07-27 VITALS — BP 104/54 | HR 80 | Temp 97.6°F | Ht 65.5 in | Wt 168.2 lb

## 2019-07-27 DIAGNOSIS — K5909 Other constipation: Secondary | ICD-10-CM

## 2019-07-27 DIAGNOSIS — K602 Anal fissure, unspecified: Secondary | ICD-10-CM

## 2019-07-27 DIAGNOSIS — K5000 Crohn's disease of small intestine without complications: Secondary | ICD-10-CM

## 2019-07-27 MED ORDER — AMBULATORY NON FORMULARY MEDICATION: 2 refills | Status: DC

## 2019-07-27 NOTE — Telephone Encounter (Signed)
We faxed in rx for nitroglycerin to [facility]

## 2019-07-27 NOTE — Patient Instructions (Addendum)
Come to our office to pick up Drug Trough Kit to carry to the lab before your labs are drawn on 08/07/2019   Take Benefiber 1 tablespoon twice daily with meals  Continue Linzess 145 mcg  We have sent a prescription for nitroglycerin 0.125% gel to [facility]. You should apply a pea size amount to your rectum three times daily x 6-8 weeks.  [facility]'s information is below: Address: [address], [place], [place] [address]  [phone]  *Please DO NOT go directly from our office to pick up this medication! Give the pharmacy 1 day to process the prescription as this is compounded and takes time to make.   Anal Fissure, Adult  An anal fissure is a small tear or crack in the tissue around the opening of the butt (anus). Bleeding from the tear or crack usually stops on its own within a few minutes. The bleeding may happen every time you poop (have a bowel movement) until the tear or crack heals. What are the causes? This condition is usually caused by passing a large or hard poop (stool). Other causes include:  Trouble pooping (constipation).  Passing watery poop (diarrhea).  Inflammatory bowel disease (Crohn's disease or ulcerative colitis).  Childbirth.  Infections.  Anal sex. What are the signs or symptoms? Symptoms of this condition include:  Bleeding from the butt.  Small amounts of blood on your poop. The blood coats the outside of the poop. It is not mixed with the poop.  Small amounts of blood on the toilet paper or in the toilet after you poop.  Pain when passing poop.  Itching or irritation around the opening of the butt. How is this diagnosed? This condition may be diagnosed based on a physical exam. Your doctor may:  Check your butt. A tear can often be seen by checking the area with care.  Check your butt using a short tube (anoscope). The light in the tube will show any problems in your butt. How is this treated? Treatment for  this condition may include:  Treating problems that make it hard for you to pass poop. You may be told to: ? Eat more fiber. ? Drink more fluid. ? Take fiber supplements. ? Take medicines that make poop soft.  Taking sitz baths. This may help to heal the tear.  Using creams and ointments. If your condition gets worse, other treatments may be needed such as:  A shot near the tear or crack (botulinum injection).  Surgery to repair the tear or crack. Follow these instructions at home: Eating and drinking   Avoid bananas and dairy products. These foods can make it hard to poop.  Drink enough fluid to keep your pee (urine) pale yellow.  Eat foods that have a lot of fiber in them, such as: ? Beans. ? Whole grains. ? Fresh fruits. ? Fresh vegetables. General instructions   Take over-the-counter and prescription medicines only as told by your doctor.  Use creams or ointments only as told by your doctor.  Keep the butt area as clean and dry as you can.  Take a warm water bath (sitz bath) as told by your doctor. Do not use soap.  Keep all follow-up visits as told by your doctor. This is important. Contact a doctor if:  You have more bleeding.  You have a fever.  You have watery poop that is mixed with blood.  You have pain.  Your problem gets worse, not better. Summary  An anal fissure is a small tear or crack in the skin around the opening of the butt (anus).  This condition is usually caused by passing a large or hard poop (stool).  Treatment includes treating the problems that make it hard for you to pass poop.  Follow your doctor's instructions about caring for your condition at home.  Keep all follow-up visits as told by your doctor. This is important. This information is not intended to replace advice given to you by your health care provider. Make sure you discuss any questions you have with your health care provider. Document Released: 06/17/2011 Document  Revised: 03/31/2018 Document Reviewed: 03/31/2018 Elsevier Patient Education  2020 [employer].

## 2019-07-27 NOTE — Telephone Encounter (Signed)
Pt stated that prescription has to be sent to [place].

## 2019-07-27 NOTE — Progress Notes (Signed)
Lacey Hess    [id number]    09-20-1981  Primary Care Physician:Tysinger, Camelia Eng, PA-C  Referring Physician: Carlena Hurl, PA-C [address] [place],  [place] [address]   Chief complaint: Crohn's disease  HPI:  38 yr F with history of Crohn's disease here for follow up visit.  Complaining of worsening constipation, on average is having 3-4 bowel movements per week.  She is also having rectal discomfort after every bowel movement since early August.  She also feels full and bloated with right lower quadrant discomfort  Last week an episode of bright red blood per rectum after defecation, small-volume on toilet paper   GI history: On Humira biweekly injection.    Humira antibody level detected at 28 and drug level 16 on September 06, 2018.   Colonoscopy December 06, 2017 showed multiple aphthous ulcers in terminal ileum, biopsy consistent with erosions and active inflammation. CT enterography April 2019 with contrast showed mild wall thickening and abnormal mucosal enhancement in terminal ileum highly suspicious for Crohn's disease.    Outpatient Encounter Medications as of 36/24/2020  Medication Sig  . acetaminophen (TYLENOL) 500 MG tablet Take 500-1,000 mg by mouth every 8 (eight) hours as needed (for pain).   . cyanocobalamin (,VITAMIN B-12,) 1000 MCG/ML injection ADMINISTER 1 ML(1000 MCG) IN THE MUSCLE EVERY 30 DAYS  . etonogestrel-ethinyl estradiol (NUVARING) 0.12-0.015 MG/24HR vaginal ring Place 1 each vaginally every 28 (twenty-eight) days. Insert vaginally and leave in place for 3 consecutive weeks, then remove for 1 week.  Marland Kitchen HUMIRA PEN 40 MG/0.4ML PNKT INJECT 1 PEN UNDER THE SKIN EVERY 14 DAYS.  Marland Kitchen LINZESS 145 MCG CAPS capsule TAKE 1 CAPSULE(145 MCG) BY MOUTH DAILY BEFORE BREAKFAST  . [DISCONTINUED] budesonide (ENTOCORT EC) 3 MG 24 hr capsule Take 3 capsules (9 mg total) by mouth daily.   No facility-administered encounter medications on file as of  07/27/2019.     Allergies as of 07/27/2019 - Review Complete 07/27/2019  Allergen Reaction Noted  . Adhesive [tape] Rash 08/29/2018    Past Medical History:  Diagnosis Date  . Anxiety   . Crohn's disease ([place])   . IBS (irritable bowel syndrome)     Past Surgical History:  Procedure Laterality Date  . BREAST LUMPECTOMY Left     Family History  Problem Relation Age of Onset  . Diabetes Maternal Aunt   . Breast cancer Maternal Grandmother   . Colon cancer Neg Hx   . Esophageal cancer Neg Hx   . Rectal cancer Neg Hx     Social History   Socioeconomic History  . Marital status: Married    Spouse name: Not on file  . Number of children: 0  . Years of education: Not on file  . Highest education level: Not on file  Occupational History  . Occupation: [employer]  Social Needs  . Financial resource strain: Not on file  . Food insecurity    Worry: Not on file    Inability: Not on file  . Transportation needs    Medical: Not on file    Non-medical: Not on file  Tobacco Use  . Smoking status: Never Smoker  . Smokeless tobacco: Never Used  Substance and Sexual Activity  . Alcohol use: Yes    Frequency: Never    Comment: 2-3 glasses weekly   . Drug use: No  . Sexual activity: Not on file  Lifestyle  . Physical activity    Days  per week: Not on file    Minutes per session: Not on file  . Stress: Not on file  Relationships  . Social Herbalist on phone: Not on file    Gets together: Not on file    Attends religious service: Not on file    Active member of club or organization: Not on file    Attends meetings of clubs or organizations: Not on file    Relationship status: Not on file  . Intimate partner violence    Fear of current or ex partner: Not on file    Emotionally abused: Not on file    Physically abused: Not on file    Forced sexual activity: Not on file  Other Topics Concern  . Not on file  Social History Narrative  . Not on file      Review  of systems: Review of Systems  Constitutional: Negative for fever and chills. Lack of energy HENT: Post nasal drip Eyes: Negative for blurred vision.  Respiratory: Negative for cough, shortness of breath and wheezing.   Cardiovascular: Negative for chest pain and palpitations.  Gastrointestinal: as per HPI Genitourinary: Negative for dysuria, urgency, frequency and hematuria.  Musculoskeletal: Negative for myalgias, back pain and joint pain.  Skin: Negative for itching and rash.  Neurological: Negative for dizziness, tremors, focal weakness, seizures and loss of consciousness.  Endo/Heme/Allergies: Positive for seasonal allergies.  Psychiatric/Behavioral: Negative for depression, suicidal ideas and hallucinations.  All other systems reviewed and are negative.   Physical Exam: Vitals:   07/27/19 0830  BP: (!) 104/54  Pulse: 80  Temp: 97.6 F (36.4 C)   Body mass index is 27.56 kg/m. Gen:      No acute distress HEENT:  EOMI, sclera anicteric Neck:     No masses; no thyromegaly Lungs:    Clear to auscultation bilaterally; normal respiratory effort CV:         Regular rate and rhythm; no murmurs Abd:      + bowel sounds; soft, non-tender; no palpable masses, no distension Ext:    No edema; adequate peripheral perfusion Skin:      Warm and dry; no rash Neuro: alert and oriented x 3 Psych: normal mood and affect Rectal exam: Increased anal sphincter tone, + anal fissure , no external hemorrhoids Anoscopy: Not performed  Data Reviewed:  Reviewed labs, radiology imaging, old records and pertinent past GI work up   Assessment and Plan/Recommendations:  38 year old female with history of Crohn's disease initially diagnosed in 2019 with predominant involvement of distal and terminal ileum on maintenance Humira biweekly  Anal fissure on rectal exam Start nitroglycerin 0.125% 3 times daily for 6 to 8 weeks Benefiber 1 tablespoon twice daily with meals  Constipation: Continue  Linzess 145 mcg daily Increase water intake to 8 to 10 cups daily   Crohn's disease Continue Humira biweekly We will recheck Humira drug trough and antibody level to exclude Crohn's flare  Return in 2 months or sooner if needed  25 minutes was spent face-to-face with the patient. Greater than 50% of the time used for counseling as well as treatment plan and follow-up. She had multiple questions which were answered to her satisfaction  K. Denzil Magnuson , MD    CC: Tysinger, Camelia Eng, PA-C

## 2019-08-02 ENCOUNTER — Encounter: Payer: Self-pay | Admitting: Gastroenterology

## 2019-08-07 ENCOUNTER — Other Ambulatory Visit (INDEPENDENT_AMBULATORY_CARE_PROVIDER_SITE_OTHER): Payer: BC Managed Care – PPO

## 2019-08-07 DIAGNOSIS — K602 Anal fissure, unspecified: Secondary | ICD-10-CM

## 2019-08-07 DIAGNOSIS — K5000 Crohn's disease of small intestine without complications: Secondary | ICD-10-CM

## 2019-08-07 DIAGNOSIS — K5909 Other constipation: Secondary | ICD-10-CM | POA: Diagnosis not present

## 2019-08-07 LAB — CBC WITH DIFFERENTIAL/PLATELET
Basophils Absolute: 0 10*3/uL (ref 0.0–0.1)
Basophils Relative: 0.6 % (ref 0.0–3.0)
Eosinophils Absolute: 0 10*3/uL (ref 0.0–0.7)
Eosinophils Relative: 0.3 % (ref 0.0–5.0)
HCT: 38.7 % (ref 36.0–46.0)
Hemoglobin: 13 g/dL (ref 12.0–15.0)
Lymphocytes Relative: 30.7 % (ref 12.0–46.0)
Lymphs Abs: 1.9 10*3/uL (ref 0.7–4.0)
MCHC: 33.5 g/dL (ref 30.0–36.0)
MCV: 94.8 fl (ref 78.0–100.0)
Monocytes Absolute: 0.4 10*3/uL (ref 0.1–1.0)
Monocytes Relative: 6.2 % (ref 3.0–12.0)
Neutro Abs: 3.8 10*3/uL (ref 1.4–7.7)
Neutrophils Relative %: 62.2 % (ref 43.0–77.0)
Platelets: 207 10*3/uL (ref 150.0–400.0)
RBC: 4.09 Mil/uL (ref 3.87–5.11)
RDW: 12.8 % (ref 11.5–15.5)
WBC: 6.1 10*3/uL (ref 4.0–10.5)

## 2019-08-07 LAB — SEDIMENTATION RATE: Sed Rate: 5 mm/hr (ref 0–20)

## 2019-08-07 LAB — COMPREHENSIVE METABOLIC PANEL
ALT: 11 U/L (ref 0–35)
AST: 13 U/L (ref 0–37)
Albumin: 4.1 g/dL (ref 3.5–5.2)
Alkaline Phosphatase: 43 U/L (ref 39–117)
BUN: 10 mg/dL (ref 6–23)
CO2: 26 mEq/L (ref 19–32)
Calcium: 9.4 mg/dL (ref 8.4–10.5)
Chloride: 105 mEq/L (ref 96–112)
Creatinine, Ser: 0.82 mg/dL (ref 0.40–1.20)
GFR: 78.75 mL/min (ref >60.00)
Glucose, Bld: 88 mg/dL (ref 70–99)
Potassium: 4.3 mEq/L (ref 3.5–5.1)
Sodium: 140 mEq/L (ref 135–145)
Total Bilirubin: 0.6 mg/dL (ref 0.2–1.2)
Total Protein: 6.7 g/dL (ref 6.0–8.3)

## 2019-08-07 LAB — C-REACTIVE PROTEIN: CRP: <1 mg/dL (ref 0.5–20.0)

## 2019-08-13 LAB — SERIAL MONITORING

## 2019-08-16 LAB — ADALIMUMAB+AB (SERIAL MONITOR)
Adalimumab Drug Level: 15 ug/mL
Anti-Adalimumab Antibody: 33 ng/mL

## 2019-09-11 ENCOUNTER — Other Ambulatory Visit: Payer: Self-pay | Admitting: Gastroenterology

## 2019-10-02 ENCOUNTER — Telehealth: Payer: Self-pay | Admitting: Gastroenterology

## 2019-10-02 NOTE — Telephone Encounter (Signed)
Pt called to inform that her insurance did not cover two blood works that she had done due to not having enough information to meet criteria that they consider medically necessary. Pt said those labs were on 2/10 and 10/5 to check her drug levels of Humira, code for lab was 80145. Pt wants to appeal decision and will be sending more information through my chart. She would like a call back.

## 2019-10-18 ENCOUNTER — Telehealth: Payer: Self-pay

## 2019-10-18 ENCOUNTER — Other Ambulatory Visit: Payer: Self-pay

## 2019-10-18 NOTE — Telephone Encounter (Signed)
Appeal on patient's behalf over denial of payment for drug level monitoring of the Humira .  [employer] saying it is "Investigational or Experimental" and therefore not covered.  Monitoring is supposed to be covered in  "at the end of induction for all anti-TNF's; at least once during maintenance therapy, at the endo of induction in primary non-responders and in patient's with confirmed secondary loss of response.  20 minute hold when trying to call [place]. Form found on the website "level one provider appeal form." Faxed with patient records and supporting references.

## 2019-10-19 NOTE — Telephone Encounter (Signed)
I am happy to speak to insurance (peer to peer review) if they will let us appeal it?

## 2019-10-31 NOTE — Telephone Encounter (Signed)
We submitted an appeal and documentation. [employer] responds "An ordering physician cannot appeal on behalf of the servicing physician." I have emailed the letter to the patient.

## 2019-12-09 ENCOUNTER — Other Ambulatory Visit: Payer: Self-pay | Admitting: Gastroenterology

## 2019-12-12 ENCOUNTER — Other Ambulatory Visit (INDEPENDENT_AMBULATORY_CARE_PROVIDER_SITE_OTHER): Payer: BC Managed Care – PPO

## 2019-12-12 ENCOUNTER — Other Ambulatory Visit: Payer: Self-pay

## 2019-12-12 ENCOUNTER — Encounter: Payer: Self-pay | Admitting: Gastroenterology

## 2019-12-12 ENCOUNTER — Ambulatory Visit: Payer: BC Managed Care – PPO | Admitting: Gastroenterology

## 2019-12-12 VITALS — BP 100/60 | HR 65 | Temp 97.4°F | Ht 65.5 in | Wt 173.0 lb

## 2019-12-12 DIAGNOSIS — K5900 Constipation, unspecified: Secondary | ICD-10-CM | POA: Diagnosis not present

## 2019-12-12 DIAGNOSIS — K5904 Chronic idiopathic constipation: Secondary | ICD-10-CM | POA: Diagnosis not present

## 2019-12-12 DIAGNOSIS — K50018 Crohn's disease of small intestine with other complication: Secondary | ICD-10-CM

## 2019-12-12 DIAGNOSIS — E538 Deficiency of other specified B group vitamins: Secondary | ICD-10-CM

## 2019-12-12 LAB — CBC WITH DIFFERENTIAL/PLATELET
Basophils Absolute: 0 10*3/uL (ref 0.0–0.1)
Basophils Relative: 0.8 % (ref 0.0–3.0)
Eosinophils Absolute: 0 10*3/uL (ref 0.0–0.7)
Eosinophils Relative: 0.6 % (ref 0.0–5.0)
HCT: 40.2 % (ref 36.0–46.0)
Hemoglobin: 13.3 g/dL (ref 12.0–15.0)
Lymphocytes Relative: 38.8 % (ref 12.0–46.0)
Lymphs Abs: 1.9 10*3/uL (ref 0.7–4.0)
MCHC: 33.1 g/dL (ref 30.0–36.0)
MCV: 95.5 fl (ref 78.0–100.0)
Monocytes Absolute: 0.4 10*3/uL (ref 0.1–1.0)
Monocytes Relative: 8 % (ref 3.0–12.0)
Neutro Abs: 2.5 10*3/uL (ref 1.4–7.7)
Neutrophils Relative %: 51.8 % (ref 43.0–77.0)
Platelets: 217 10*3/uL (ref 150.0–400.0)
RBC: 4.21 Mil/uL (ref 3.87–5.11)
RDW: 12.9 % (ref 11.5–15.5)
WBC: 4.9 10*3/uL (ref 4.0–10.5)

## 2019-12-12 LAB — COMPREHENSIVE METABOLIC PANEL
ALT: 13 U/L (ref 0–35)
AST: 16 U/L (ref 0–37)
Albumin: 4.3 g/dL (ref 3.5–5.2)
Alkaline Phosphatase: 46 U/L (ref 39–117)
BUN: 10 mg/dL (ref 6–23)
CO2: 27 mEq/L (ref 19–32)
Calcium: 9.9 mg/dL (ref 8.4–10.5)
Chloride: 104 mEq/L (ref 96–112)
Creatinine, Ser: 0.76 mg/dL (ref 0.40–1.20)
GFR: 85.81 mL/min (ref >60.00)
Glucose, Bld: 83 mg/dL (ref 70–99)
Potassium: 3.9 mEq/L (ref 3.5–5.1)
Sodium: 138 mEq/L (ref 135–145)
Total Bilirubin: 0.5 mg/dL (ref 0.2–1.2)
Total Protein: 7.1 g/dL (ref 6.0–8.3)

## 2019-12-12 LAB — IBC + FERRITIN
Ferritin: 12.6 ng/mL (ref 10.0–291.0)
Iron: 131 ug/dL (ref 42–145)
Saturation Ratios: 28.4 % (ref 20.0–50.0)
Transferrin: 329 mg/dL (ref 212.0–360.0)

## 2019-12-12 LAB — VITAMIN B12: Vitamin B-12: 276 pg/mL (ref 211–911)

## 2019-12-12 LAB — HIGH SENSITIVITY CRP: CRP, High Sensitivity: 1.35 mg/L (ref 0.000–5.000)

## 2019-12-12 LAB — FOLATE: Folate: >24.1 ng/mL (ref >5.9)

## 2019-12-12 MED ORDER — "TUBERCULIN SYRINGE 26G X 3/8"" 1 ML MISC": 0 refills | Status: DC

## 2019-12-12 NOTE — Patient Instructions (Addendum)
Go to the basement for labs today  We have given you Linzess samples today  If you are age 37 or older, your body mass index should be between 23-30. Your Body mass index is 28.35 kg/m. If this is out of the aforementioned range listed, please consider follow up with your Primary Care Provider.  If you are age 2 or younger, your body mass index should be between 19-25. Your Body mass index is 28.35 kg/m. If this is out of the aformentioned range listed, please consider follow up with your Primary Care Provider.    I appreciate the  opportunity to care for you  Thank You   Harl Bowie , MD

## 2019-12-12 NOTE — Progress Notes (Signed)
Lacey Hess    [id number]    1980-12-01  Primary Care Physician:Tysinger, Camelia Eng, PA-C  Referring Physician: Carlena Hurl, PA-C [address] [place],  [place] [address]   Chief complaint:  Crohn's disease  HPI:  39 year old female with Crohn's disease here for follow-up visit  She continues to have intermittent abdominal bloating, on average once a month.  She gets severe constipation preceding the episode of bloating and it subsides with dietary changes.  No relationship to dosing of Humira and has not identified any other triggers. She is taking daily Linzess with 4-5 bowel movements per week on average.  Occasionally she has to take additional dose of Linzess.  Denies any nausea, vomiting, abdominal pain, melena or bright red blood per rectum  GI history: On Humira biweekly injection.  Humira antibody level detected at 28 and drug level 16 on September 06, 2018.  Colonoscopy December 06, 2017 showed multiple aphthous ulcers in terminal ileum, biopsy consistent with erosions and active inflammation. CT enterography April 2019 with contrast showed mild wall thickening and abnormal mucosal enhancement in terminal ileum highly suspicious for Crohn's disease.   Outpatient Encounter Medications as of 12/12/2019  Medication Sig  . acetaminophen (TYLENOL) 500 MG tablet Take 500-1,000 mg by mouth every 8 (eight) hours as needed (for pain).   . AMBULATORY NON FORMULARY MEDICATION Medication Name: Nitroglycerin Ointment 0.125% use pea sized amount per rectum three times a day for 6-8 weeks  . cyanocobalamin (,VITAMIN B-12,) 1000 MCG/ML injection ADMINISTER 1 ML(1000 MCG) IN THE MUSCLE EVERY 30 DAYS  . etonogestrel-ethinyl estradiol (NUVARING) 0.12-0.015 MG/24HR vaginal ring Place 1 each vaginally every 28 (twenty-eight) days. Insert vaginally and leave in place for 3 consecutive weeks, then remove for 1 week.  Marland Kitchen HUMIRA PEN 40 MG/0.4ML PNKT INJECT 1 PEN UNDER THE  SKIN EVERY 14 DAYS.  Marland Kitchen LINZESS 145 MCG CAPS capsule TAKE 1 CAPSULE(145 MCG) BY MOUTH DAILY BEFORE BREAKFAST   No facility-administered encounter medications on file as of 12/12/2019.    Allergies as of 12/12/2019 - Review Complete 12/12/2019  Allergen Reaction Noted  . Adhesive [tape] Rash 08/29/2018    Past Medical History:  Diagnosis Date  . Anxiety   . Crohn's disease ([place])   . IBS (irritable bowel syndrome)     Past Surgical History:  Procedure Laterality Date  . BREAST LUMPECTOMY Left     Family History  Problem Relation Age of Onset  . Diabetes Maternal Aunt   . Breast cancer Maternal Grandmother   . Colon cancer Neg Hx   . Esophageal cancer Neg Hx   . Rectal cancer Neg Hx     Social History   Socioeconomic History  . Marital status: Married    Spouse name: Not on file  . Number of children: 0  . Years of education: Not on file  . Highest education level: Not on file  Occupational History  . Occupation: [employer]  Tobacco Use  . Smoking status: Never Smoker  . Smokeless tobacco: Never Used  Substance and Sexual Activity  . Alcohol use: Yes    Comment: 2-3 glasses weekly   . Drug use: No  . Sexual activity: Not on file  Other Topics Concern  . Not on file  Social History Narrative  . Not on file   Social Determinants of Health   Financial Resource Strain:   . Difficulty of Paying Living Expenses: Not on file  Food Insecurity:   .  Worried About Charity fundraiser in the Last Year: Not on file  . Ran Out of Food in the Last Year: Not on file  Transportation Needs:   . Lack of Transportation (Medical): Not on file  . Lack of Transportation (Non-Medical): Not on file  Physical Activity:   . Days of Exercise per Week: Not on file  . Minutes of Exercise per Session: Not on file  Stress:   . Feeling of Stress : Not on file  Social Connections:   . Frequency of Communication with Friends and Family: Not on file  . Frequency of Social Gatherings with  Friends and Family: Not on file  . Attends Religious Services: Not on file  . Active Member of Clubs or Organizations: Not on file  . Attends Archivist Meetings: Not on file  . Marital Status: Not on file  Intimate Partner Violence:   . Fear of Current or Ex-Partner: Not on file  . Emotionally Abused: Not on file  . Physically Abused: Not on file  . Sexually Abused: Not on file      Review of systems:  All other review of systems negative except as mentioned in the HPI.   Physical Exam: Vitals:   12/12/19 0822  BP: 100/60  Pulse: 65  Temp: (!) 97.4 F (36.3 C)   Body mass index is 28.35 kg/m. Gen:      No acute distress Abd:      + bowel sounds; soft, non-tender; no palpable masses, no distension Ext:    No edema; adequate peripheral perfusion Neuro: alert and oriented x 3 Psych: normal mood and affect  Data Reviewed:  Reviewed labs, radiology imaging, old records and pertinent past GI work up   Assessment and Plan/Recommendations:  39 year old female with history of Crohn's disease predominantly involving terminal and distal ileum in clinical remission on biweekly Humira  Continue Humira every 2 weeks Check CBC, CMP, folate, iron panel, CRP TB QuantiFERON gold  Chronic idiopathic constipation: Continue Linzess 145 mcg daily, will provide samples of 290 mcg daily for trial for 1 to 2 weeks.  If has significant improvement and is able to have consistent bowel movement with no diarrhea we will change the prescription to Linzess 290 mcg daily  Vitamin B12 deficiency: She has not been consistently taking the B12 injections, will recheck the level and decide the frequency of B12 injection based on that  This visit required 35 minutes of patient care (this includes precharting, chart review, review of results, face-to-face time used for counseling as well as treatment plan and follow-up. The patient was provided an opportunity to ask questions and all were  answered. The patient agreed with the plan and demonstrated an understanding of the instructions.  Damaris Hippo , MD    CC: Tysinger, Camelia Eng, PA-C

## 2019-12-14 ENCOUNTER — Telehealth: Payer: Self-pay | Admitting: Medical

## 2019-12-14 ENCOUNTER — Other Ambulatory Visit: Payer: Self-pay

## 2019-12-14 ENCOUNTER — Encounter: Payer: Self-pay | Admitting: Gastroenterology

## 2019-12-14 MED ORDER — "TUBERCULIN SYRINGE 26G X 3/8"" 1 ML MISC": 0 refills | Status: DC

## 2019-12-14 NOTE — Telephone Encounter (Signed)
I reviewed their recent GI note.  Please recommend them scheduling a yearly physical/well visit with [place].  Not really sure who is PCP

## 2019-12-14 NOTE — Telephone Encounter (Signed)
Pt scheduled  

## 2019-12-15 LAB — QUANTIFERON-TB GOLD PLUS
Mitogen-NIL: >10 IU/mL
NIL: 0.06 IU/mL
QuantiFERON-TB Gold Plus: NEGATIVE
TB1-NIL: 0.02 IU/mL
TB2-NIL: 0.01 IU/mL

## 2020-01-04 ENCOUNTER — Other Ambulatory Visit: Payer: Self-pay

## 2020-01-04 ENCOUNTER — Ambulatory Visit (INDEPENDENT_AMBULATORY_CARE_PROVIDER_SITE_OTHER): Payer: BC Managed Care – PPO | Admitting: Obstetrics and Gynecology

## 2020-01-04 VITALS — BP 115/72 | HR 70 | Wt 174.0 lb

## 2020-01-04 DIAGNOSIS — Z1151 Encounter for screening for human papillomavirus (HPV): Secondary | ICD-10-CM | POA: Diagnosis not present

## 2020-01-04 DIAGNOSIS — Z803 Family history of malignant neoplasm of breast: Secondary | ICD-10-CM | POA: Diagnosis not present

## 2020-01-04 DIAGNOSIS — Z113 Encounter for screening for infections with a predominantly sexual mode of transmission: Secondary | ICD-10-CM | POA: Diagnosis not present

## 2020-01-04 DIAGNOSIS — Z01419 Encounter for gynecological examination (general) (routine) without abnormal findings: Secondary | ICD-10-CM | POA: Diagnosis not present

## 2020-01-04 DIAGNOSIS — Z124 Encounter for screening for malignant neoplasm of cervix: Secondary | ICD-10-CM | POA: Diagnosis not present

## 2020-01-04 NOTE — Progress Notes (Signed)
Fibroids and chrones disease 2 years ago.   Paternal grandmother and maternal grandmother; aunt on both sides.       GYNECOLOGY ANNUAL PREVENTATIVE CARE ENCOUNTER NOTE  History:     Lacey Hess is a 39 y.o. No obstetric history on file. female here for a routine annual gynecologic exam.  Current complaints: none.   Denies abnormal vaginal bleeding, discharge, pelvic pain, problems with intercourse or other gynecologic concerns. Currently in school getting her PHD. Planning pregnancy in the next 3 years. Wife is Lacey Hess.    Gynecologic History No LMP recorded. Contraception: NuvaRing vaginal inserts Last Pap: in the last 5 years.  Results were: normal Last mammogram: NA.   Obstetric History OB History  No obstetric history on file.    Past Medical History:  Diagnosis Date  . Anxiety   . Crohn's disease ([place])   . IBS (irritable bowel syndrome)     Past Surgical History:  Procedure Laterality Date  . BREAST LUMPECTOMY Left     Current Outpatient Medications on File Prior to Visit  Medication Sig Dispense Refill  . acetaminophen (TYLENOL) 500 MG tablet Take 500-1,000 mg by mouth every 8 (eight) hours as needed (for pain).     . AMBULATORY NON FORMULARY MEDICATION Medication Name: Nitroglycerin Ointment 0.125% use pea sized amount per rectum three times a day for 6-8 weeks 30 g 2  . cyanocobalamin (,VITAMIN B-12,) 1000 MCG/ML injection ADMINISTER 1 ML(1000 MCG) IN THE MUSCLE EVERY 30 DAYS 1 mL 12  . HUMIRA PEN 40 MG/0.4ML PNKT INJECT 1 PEN UNDER THE SKIN EVERY 14 DAYS. 2 each 10  . LINZESS 145 MCG CAPS capsule TAKE 1 CAPSULE(145 MCG) BY MOUTH DAILY BEFORE BREAKFAST 30 capsule 2  . TUBERCULIN SYR 1CC/26GX3/8" (B-D TB SYRINGE 1CC/26GX3/8") 26G X 3/8" 1 ML MISC Use every 4 weeks to administer Vitamin B12 injection IM 30 each 0   No current facility-administered medications on file prior to visit.    Allergies  Allergen Reactions  . Adhesive [Tape] Rash    Social  History:  reports that she has never smoked. She has never used smokeless tobacco. She reports current alcohol use. She reports that she does not use drugs.  Family History  Problem Relation Age of Onset  . Diabetes Maternal Aunt   . Breast cancer Maternal Grandmother   . Colon cancer Neg Hx   . Esophageal cancer Neg Hx   . Rectal cancer Neg Hx     The following portions of the patient's history were reviewed and updated as appropriate: allergies, current medications, past family history, past medical history, past social history, past surgical history and problem list.  Review of Systems Pertinent items noted in HPI and remainder of comprehensive ROS otherwise negative.  Physical Exam:  BP 115/72   Pulse 70   Wt 174 lb (78.9 kg)   BMI 28.51 kg/m  CONSTITUTIONAL: Well-developed, well-nourished female in no acute distress.  HENT:  Normocephalic, atraumatic, External right and left ear normal. Oropharynx is clear and moist EYES: Conjunctivae and EOM are normal. Pupils are equal, round, and reactive to light. No scleral icterus.  NECK: Normal range of motion, supple, no masses.  Normal thyroid.  SKIN: Skin is warm and dry. No rash noted. Not diaphoretic. No erythema. No pallor. MUSCULOSKELETAL: Normal range of motion. No tenderness.  No cyanosis, clubbing, or edema.  2+ distal pulses. NEUROLOGIC: Alert and oriented to person, place, and time. Normal reflexes, muscle tone coordination.  PSYCHIATRIC: Normal mood  and affect. Normal behavior. Normal judgment and thought content. CARDIOVASCULAR: Normal heart rate noted, regular rhythm RESPIRATORY: Clear to auscultation bilaterally. Effort and breath sounds normal, no problems with respiration noted. BREASTS: Symmetric in size. No masses, tenderness, skin changes, nipple drainage, or lymphadenopathy bilaterally. ABDOMEN: Soft, no distention noted.  No tenderness, rebound or guarding.  PELVIC: Normal appearing external genitalia and urethral  meatus; normal appearing vaginal mucosa and cervix.  No abnormal discharge noted.  Pap smear obtained.  Normal uterine size, no other palpable masses, no uterine or adnexal tenderness.   Assessment and Plan:   1. Women's annual routine gynecological examination  - Cytology - PAP( [facility]) - MM Digital Screening; Future - TSH  2. Family history of breast cancer  - Baseline mammogram. Maternal grandmother pass away from breast cancer.    Will follow up results of pap smear and manage accordingly. Mammogram scheduled Routine preventative health maintenance measures emphasized. Please refer to After Visit Summary for other counseling recommendations.     Lacey Hess, Lacey Hess, Lacey Hess for [employer], [place]

## 2020-01-05 DIAGNOSIS — Z01419 Encounter for gynecological examination (general) (routine) without abnormal findings: Secondary | ICD-10-CM | POA: Insufficient documentation

## 2020-01-05 DIAGNOSIS — Z803 Family history of malignant neoplasm of breast: Secondary | ICD-10-CM | POA: Insufficient documentation

## 2020-01-05 DIAGNOSIS — Z13220 Encounter for screening for lipoid disorders: Secondary | ICD-10-CM | POA: Insufficient documentation

## 2020-01-05 LAB — CYTOLOGY - PAP
Chlamydia: NEGATIVE
Comment: NEGATIVE
Comment: NEGATIVE
Comment: NORMAL
Diagnosis: NEGATIVE
High risk HPV: NEGATIVE
Neisseria Gonorrhea: NEGATIVE

## 2020-01-05 LAB — TSH: TSH: 2.78 u[IU]/mL (ref 0.450–4.500)

## 2020-01-05 MED ORDER — ETONOGESTREL-ETHINYL ESTRADIOL 0.12-0.015 MG/24HR VA RING: 1.0000 | VAGINAL_RING | VAGINAL | 12 refills | Status: DC

## 2020-01-11 ENCOUNTER — Other Ambulatory Visit: Payer: Self-pay

## 2020-01-11 ENCOUNTER — Encounter: Payer: Self-pay | Admitting: Medical

## 2020-01-11 ENCOUNTER — Ambulatory Visit: Payer: BC Managed Care – PPO | Admitting: Medical

## 2020-01-11 VITALS — BP 128/78 | HR 74 | Temp 98.2°F | Ht 66.0 in

## 2020-01-11 DIAGNOSIS — K50919 Crohn's disease, unspecified, with unspecified complications: Secondary | ICD-10-CM

## 2020-01-11 DIAGNOSIS — Z7189 Other specified counseling: Secondary | ICD-10-CM | POA: Diagnosis not present

## 2020-01-11 DIAGNOSIS — Z79899 Other long term (current) drug therapy: Secondary | ICD-10-CM | POA: Insufficient documentation

## 2020-01-11 DIAGNOSIS — Z7185 Encounter for immunization safety counseling: Secondary | ICD-10-CM | POA: Insufficient documentation

## 2020-01-11 DIAGNOSIS — Z13220 Encounter for screening for lipoid disorders: Secondary | ICD-10-CM | POA: Diagnosis not present

## 2020-01-11 DIAGNOSIS — Z0000 Encounter for general adult medical examination without abnormal findings: Secondary | ICD-10-CM | POA: Diagnosis not present

## 2020-01-11 NOTE — Patient Instructions (Signed)
Preventative Care for Adults - Female   Thank you for coming in for your well visit today, and thank you for trusting [place] with your care!   Maintain regular health and wellness exams:  A routine yearly physical is a good way to check in with your primary care provider about your health and preventive screening. It is also an opportunity to share updates about your health and any concerns you have, and receive a thorough all-over exam.   Most health insurance companies pay for at least some preventative services.  Check with your health plan for specific coverages.  What preventative services do women need?  Adult women should have their weight and blood pressure checked regularly.   Women age 23 and older should have their cholesterol levels checked regularly.  Women should be screened for cervical cancer with a Pap smear and pelvic exam beginning at either age 46, or 3 years after they become sexually activity.    Breast cancer screening generally begins at age 80 with a mammogram and breast exam by your primary care provider.    Beginning at age 63 and continuing to age 76, women should be screened for colorectal cancer.  Certain people may need continued testing until age 64.  Updating vaccinations is part of preventative care.  Vaccinations help protect against diseases such as the flu.  Osteoporosis is a disease in which the bones lose minerals and strength as we age. Women ages 53 and over should discuss this with their caregivers, as should women after menopause who have other risk factors.  Lab tests are generally done as part of preventative care to screen for anemia and blood disorders, to screen for problems with the kidneys and liver, to screen for bladder problems, to check blood sugar, and to check your cholesterol level.  Preventative services generally include counseling about diet, exercise, avoiding tobacco, drugs, excessive alcohol consumption, and sexually transmitted  infections.    Xrays and CT scans are not normally done as a preventative test, and most insurances do not pay for imaging for screening other than as discussed under cancer screens below.   On the other hand, if you have certain medical concerns, imaging may be necessary as a diagnostic test.   Your Medical Team Your medical team starts with [place], your PCP or primary care provider.  Please use our services for your routine care such as physicals, screenings, immunizations, sick visits, and your first stop for general medical concerns.  You can call our number for after hours information for urgent questions that may need attention but cannot wait til the next business day.    Urgent care-urgent cares exist to provide care when your primary care office would typically be closed such as evenings or weekends.   Urgent care is for evaluation of urgent medical problems that do not necessarily require emergency department care, but cannot wait til the next business day when we are open.  Emergency department care-please reserve emergency department care for serious, urgent, possibly life-threatening medical problems.  This includes issues like possible stroke, heart attack, significant injury, mental health crisis, or other urgent need that requires immediate medical attention.     See your dentist office twice yearly for hygiene and cleaning visits.   Brush your teeth and floss your teeth daily.  See your eye doctor yearly for routine eye exam and screenings for glaucoma and retinal disease.  See your gynecologist yearly if you do not have your female/gynecological exams at our  office.      Vaccines:  Stay up to date with your tetanus shots and other required immunizations. You should have a booster for tetanus every 10 years. Be sure to get your flu shot every year, since 5%-20% of the [place] population comes down with the flu. The flu vaccine changes each year, so being vaccinated once is not enough.  Get your shot in the fall, before the flu season peaks.   Other vaccines to consider:  Pneumococcal vaccine to protect against certain types of pneumonia.  This is normally recommended for adults age 37 or older.  However, adults younger than 37 years old with certain underlying conditions such as diabetes, heart or lung disease should also receive the vaccine.  Shingles vaccine to protect against Varicella Zoster if you are older than age 55, or younger than 37 years old with certain underlying illness.  If you have not had the Shingrix vaccine, please call your insurer to inquire about coverage for the Shingrix vaccine given in 2 doses.   Some insurers cover this vaccine after age 13, some cover this after age 60.  If your insurer covers this, then call to schedule appointment to have this vaccine here  Hepatitis A vaccine to protect against a form of infection of the liver by a virus acquired from food.  Hepatitis B vaccine to protect against a form of infection of the liver by a virus acquired from blood or body fluids, particularly if you work in health care.  If you plan to travel internationally, check with your local health department for specific vaccination recommendations.  Human Papilloma Virus or HPV causes cancer of the cervix, and other infections that can be transmitted from person to person. There is a vaccine for HPV, and males should get immunized between the ages of 27 and 44. It requires a series of 3 shots.   Covid/Coronavirus - as the vaccines are becoming available, please consider vaccination if you are a health care worker, first responder, or have significant health problems such as asthma, COPD, heart disease, hypertension, diabetes, obesity, multiple medical problems, over age 72yo, or immunocompromised.      What should I know about Cancer screening? Many types of cancers can be detected early and may often be prevented. Lung Cancer  You should be screened every  year for lung cancer if: ? You are a current smoker who has smoked for at least 30 years. ? You are a former smoker who has quit within the past 15 years.  Talk to your health care provider about your screening options, when you should start screening, and how often you should be screened.  Breast cancer screening is essential to preventive care for women. All women age 57 and older should perform a breast self-exam every month. At age 70 and older, women should have their caregiver complete a breast exam each year. Women at ages 29 and older should have a mammogram (x-ray film) of the breasts. Your caregiver can discuss how often you need mammograms.    Breast cancer screening   [facility]   [phone]         [phone] [address] [place], [address] [place], [place] [address]        [place], [place] [address]  Cervical cancer screening includes taking a Pap smear (sample of cells examined under a microscope)  from the cervix (end of the uterus). It also includes testing for HPV (Human Papilloma Virus, which can cause cervical cancer). Screening and a pelvic exam should begin at age 29, or 3 years after a woman becomes sexually active. Screening should occur every year, with a Pap smear but no HPV testing, up to age 28. After age 83, you should have a Pap smear every 3 years with HPV testing, if no HPV was found previously.   Colorectal Cancer  Routine colorectal cancer screening usually begins at 37 years of age and should be repeated every 5-10 years until you are 37 years old. You may need to be screened more often if early forms of precancerous polyps or small growths are found. Your health care provider may recommend screening at an earlier age if you have risk factors for colon cancer.  Your health care provider may recommend using home test kits to check for hidden blood in the stool.  A small camera  at the end of a tube can be used to examine your colon (sigmoidoscopy or colonoscopy). This checks for the earliest forms of colorectal cancer.  Skin Cancer  Check your skin from head to toe regularly.  Tell your health care provider about any new moles or changes in moles, especially if: ? There is a change in a mole's size, shape, or color. ? You have a mole that is larger than a pencil eraser.  Always use sunscreen. Apply sunscreen liberally and repeat throughout the day.  Protect yourself by wearing long sleeves, pants, a wide-brimmed hat, and sunglasses when outside.   GENERAL RECOMMENDATIONS FOR GOOD HEALTH:  Healthy diet:  Eat a variety of foods, including fruit, vegetables, animal or vegetable protein, such as meat, fish, chicken, and eggs, or beans, lentils, tofu, and grains, such as rice.  Drink plenty of water daily.  Decrease saturated fat in the diet, avoid lots of red meat, processed foods, sweets, fast foods, and fried foods.  Exercise:  Aerobic exercise helps maintain good heart health. At least 30-40 minutes of moderate-intensity exercise is recommended. For example, a brisk walk that increases your heart rate and breathing. This should be done on most days of the week.   Find a type of exercise or a variety of exercises that you enjoy so that it becomes a part of your daily life.  Examples are running, walking, swimming, water aerobics, and biking.  For motivation and support, explore group exercise such as aerobic class, spin class, Zumba, Yoga,or  martial arts, etc.    Set exercise goals for yourself, such as a certain weight goal, walk or run in a race such as a 5k walk/run.  Speak to your primary care provider about exercise goals.  Your weight readings per our records: Wt Readings from Last 3 Encounters:  01/04/20 174 lb (78.9 kg)  12/12/19 173 lb (78.5 kg)  07/27/19 168 lb 3.2 oz (76.3 kg)    Body mass index is 28.08 kg/m.    Disease  prevention:  If you smoke or chew tobacco, find out from your caregiver how to quit. It can literally save your life, no matter how long you have been a tobacco user. If you do not use tobacco, never begin.   Maintain a healthy diet and normal weight. Increased weight leads to problems with blood pressure and diabetes.   The Body Mass Index or BMI is a way of measuring how much of your body is fat. Having a BMI above 27  increases the risk of heart disease, diabetes, hypertension, stroke and other problems related to obesity. Your caregiver can help determine your BMI and based on it develop an exercise and dietary program to help you achieve or maintain this important measurement at a healthful level.  High blood pressure causes heart and blood vessel problems.  Persistent high blood pressure should be treated with medicine if weight loss and exercise do not work.  Your blood pressure readings per our records:     BP Readings from Last 3 Encounters:  01/11/20 128/78  01/04/20 115/72  12/12/19 100/60     Fat and cholesterol leaves deposits in your arteries that can block them. This causes heart disease and vessel disease elsewhere in your body.  If your cholesterol is found to be high, or if you have heart disease or certain other medical conditions, then you may need to have your cholesterol monitored frequently and be treated with medication.   Ask if you should have a cardiac stress test if your history suggests this. A stress test is a test done on a treadmill that looks for heart disease. This test can find disease prior to there being a problem.   Menopause can be associated with physical symptoms and risks. Hormone replacement therapy is available to decrease these. You should talk to your caregiver about whether starting or continuing to take hormones is right for you.   Osteoporosis is a disease in which the bones lose minerals and strength as we age. This can result in serious bone  fractures. Risk of osteoporosis can be identified using a bone density scan. Women ages 56 and over should discuss this with their caregivers, as should women after menopause who have other risk factors. Ask your caregiver whether you should be taking a calcium supplement and Vitamin D, to reduce the rate of osteoporosis.   Osteoporosis screening/bone density testing:  [facility]   [phone]          [phone] [address] [place], [address] [place], [place] [address]        [place], [place] [address]    Avoid drinking alcohol in excess (more than two drinks per day).  Avoid use of street drugs. Do not share needles with anyone. Ask for professional help if you need assistance or instructions on stopping the use of alcohol, cigarettes, and/or drugs.  Brush your teeth twice a day with fluoride toothpaste, and floss once a day. Good oral hygiene prevents tooth decay and gum disease. The problems can be painful, unattractive, and can cause other health problems. Visit your dentist for a routine oral and dental check up and preventive care every 6-12 months.   Safety:  Use seatbelts 100% of the time, whether driving or as a passenger.  Use safety devices such as hearing protection if you work in environments with loud noise or significant background noise.  Use safety glasses when doing any work that could send debris in to the eyes.  Use a helmet if you ride a bike or motorcycle.  Use appropriate safety gear for contact sports.  Talk to your caregiver about gun safety.  Use sunscreen with a SPF (or skin protection factor) of 15 or greater.  Lighter skinned people are at a greater risk of skin cancer. Don't forget to also wear sunglasses in order to protect your eyes from too much  damaging sunlight. Damaging sunlight can accelerate cataract formation.   Keep carbon monoxide and smoke detectors in your home  functioning at all times. Change the batteries every 6 months or use a model that plugs into the wall.    Sexual activity: . Sex is a normal part of life and sexual activity can continue into older adulthood for many healthy people.   . If you are having issues related to sexual activity, please follow up to discuss this further.   . If you are not in a monogamous relationship or have more than one partner, please practice safe sex.  Use condoms. Condoms are used for birth control and to help reduce the spread of sexually transmitted infections (or STIs).  Some of the STIs are gonorrhea (the clap), chlamydia, syphilis, trichomonas, herpes, HPV (human papilloma virus) and HIV (human immunodeficiency virus) which causes AIDS. The herpes, HIV and HPV are viral illnesses that have no cure. These can result in disability, cancer and death.   We are able to test for STIs here at our office.

## 2020-01-11 NOTE — Progress Notes (Signed)
Subjective:   HPI  Lacey Hess is a 39 y.o. female who presents for Chief Complaint  Patient presents with  . Annual Exam    with fasting labs     Patient Care Team: Abimael Zeiter, Camelia Eng, PA-C as PCP - General (Family Medicine) Sees dentist Sees eye doctor  Concerns: Does B12 injection at home x last 2 years.   Diagnosed few years ago.  Past Medical History:  Diagnosis Date  . B12 deficiency 2019  . Crohn's disease ([place]) 12/2017    Past Surgical History:  Procedure Laterality Date  . BREAST LUMPECTOMY Left    age 46yo  . COLONOSCOPY  12/2017   mult ulcers in terminal ileum, othewise normal colon.  Dr. Harl Bowie    Social History   Socioeconomic History  . Marital status: Married    Spouse name: Not on file  . Number of children: 0  . Years of education: Not on file  . Highest education level: Not on file  Occupational History  . Occupation: [employer]  Tobacco Use  . Smoking status: Never Smoker  . Smokeless tobacco: Never Used  Substance and Sexual Activity  . Alcohol use: Yes    Comment: 2-3 glasses weekly   . Drug use: No  . Sexual activity: Not on file  Other Topics Concern  . 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  Exercise - peloton, yoga, weights.  01/2020.     Social Determinants of Health   Financial Resource Strain:   . Difficulty of Paying Living Expenses:   Food Insecurity:   . Worried About Charity fundraiser in the Last Year:   . Arboriculturist in the Last Year:   Transportation Needs:   . Film/video editor (Medical):   Marland Kitchen Lack of Transportation (Non-Medical):   Physical Activity:   . Days of Exercise per Week:   . Minutes of Exercise per Session:   Stress:   . Feeling of Stress :   Social Connections:   . Frequency of Communication with Friends and Family:   . Frequency of Social Gatherings with Friends and Family:   . Attends Religious  Services:   . Active Member of Clubs or Organizations:   . Attends Archivist Meetings:   Marland Kitchen Marital Status:   Intimate Partner Violence:   . Fear of Current or Ex-Partner:   . Emotionally Abused:   Marland Kitchen Physically Abused:   . Sexually Abused:     Family History  Problem Relation Age of Onset  . Diabetes Maternal Aunt   . Crohn's disease Mother   . Hepatitis Father   . Cancer Maternal Grandmother        lung  . Drug abuse Brother   . Heart disease Paternal Aunt        CHF  . Cancer Paternal Grandmother        breast  . Heart disease Paternal Grandmother        CHF  . Colon cancer Neg Hx   . Esophageal cancer Neg Hx   . Rectal cancer Neg Hx      Current Outpatient Medications:  .  acetaminophen (TYLENOL) 500 MG tablet, Take 500-1,000 mg by mouth every 8 (eight) hours as needed (for pain). , Disp: , Rfl:  .  cyanocobalamin (,VITAMIN B-12,) 1000 MCG/ML injection, ADMINISTER 1 ML(1000 MCG) IN THE MUSCLE EVERY 30  DAYS, Disp: 1 mL, Rfl: 12 .  etonogestrel-ethinyl estradiol (NUVARING) 0.12-0.015 MG/24HR vaginal ring, Place 1 each vaginally every 28 (twenty-eight) days. Insert vaginally and leave in place for 3 consecutive weeks, then remove for 1 week., Disp: 1 each, Rfl: 12 .  HUMIRA PEN 40 MG/0.4ML PNKT, INJECT 1 PEN UNDER THE SKIN EVERY 14 DAYS., Disp: 2 each, Rfl: 10 .  LINZESS 145 MCG CAPS capsule, TAKE 1 CAPSULE(145 MCG) BY MOUTH DAILY BEFORE BREAKFAST, Disp: 30 capsule, Rfl: 2 .  TUBERCULIN SYR 1CC/26GX3/8" (B-D TB SYRINGE 1CC/26GX3/8") 26G X 3/8" 1 ML MISC, Use every 4 weeks to administer Vitamin B12 injection IM, Disp: 30 each, Rfl: 0 .  AMBULATORY NON FORMULARY MEDICATION, Medication Name: Nitroglycerin Ointment 0.125% use pea sized amount per rectum three times a day for 6-8 weeks (Patient not taking: Reported on 01/11/2020), Disp: 30 g, Rfl: 2  Allergies  Allergen Reactions  . Adhesive [Tape] Rash     Reviewed their medical, surgical, family, social,  medication, and allergy history and updated chart as appropriate.   Review of Systems Constitutional: -fever, -chills, -sweats, -unexpected weight change, -decreased appetite, -fatigue Allergy: -sneezing, -itching, -congestion Dermatology: -changing moles, --rash, -lumps ENT: -runny nose, -ear pain, -sore throat, -hoarseness, -sinus pain, -teeth pain, - ringing in ears, -hearing loss, -nosebleeds Cardiology: -chest pain, -palpitations, -swelling, -difficulty breathing when lying flat, -waking up short of breath Respiratory: -cough, -shortness of breath, -difficulty breathing with exercise or exertion, -wheezing, -coughing up blood Gastroenterology: -abdominal pain, -nausea, -vomiting, -diarrhea, +constipation, -blood in stool, -changes in bowel movement, -difficulty swallowing or eating Hematology: -bleeding, -bruising  Musculoskeletal: -joint aches, -muscle aches, -joint swelling, -back pain, -neck pain, -cramping, -changes in gait Ophthalmology: denies vision changes, eye redness, itching, discharge Urology: -burning with urination, -difficulty urinating, -blood in urine, -urinary frequency, -urgency, -incontinence Neurology: -headache, -weakness, -tingling, -numbness, -memory loss, -falls, -dizziness Psychology: -depressed mood, -agitation, -sleep problems Breast/gyn: -breast tendnerss, -discharge, -lumps, -vaginal discharge,- irregular periods, -heavy periods     Objective:  BP 128/78   Pulse 74   Temp 98.2 F (36.8 C)   Ht 5' 6"  (1.676 m)   SpO2 100%   BMI 28.08 kg/m   General appearance: alert, no distress, WD/WN, Caucasian female Skin: right upper and left upper arm tattoos, tattoo medial right foot, scattered macules, no worrisome lesions HEENT: normocephalic, conjunctiva/corneas normal, sclerae anicteric, PERRLA, EOMi, nares patent, no discharge or erythema, pharynx normal Oral cavity: MMM, tongue normal, teeth normal Neck: supple, no lymphadenopathy, no thyromegaly, no  masses, normal ROM, no bruits Chest: non tender, normal shape and expansion Heart: RRR, normal S1, S2, no murmurs Lungs: CTA bilaterally, no wheezes, rhonchi, or rales Abdomen: +bs, soft, non tender, non distended, no masses, no hepatomegaly, no splenomegaly, no bruits Back: non tender, normal ROM, no scoliosis Musculoskeletal: upper extremities non tender, no obvious deformity, normal ROM throughout, lower extremities non tender, no obvious deformity, normal ROM throughout Extremities: no edema, no cyanosis, no clubbing Pulses: 2+ symmetric, upper and lower extremities, normal cap refill Neurological: alert, oriented x 3, CN2-12 intact, strength normal upper extremities and lower extremities, sensation normal throughout, DTRs 2+ throughout, no cerebellar signs, gait normal Psychiatric: normal affect, behavior normal, pleasant  Breast/gyn/rectal - deferred to gynecology   Assessment and Plan :   Encounter Diagnoses  Name Primary?  . Encounter for health maintenance examination in adult Yes  . Screening for lipid disorders   . Crohn's disease with complication, unspecified gastrointestinal tract location ([place])   . Vaccine counseling   .  High risk medication use     Physical exam - discussed and counseled on healthy lifestyle, diet, exercise, preventative care, vaccinations, sick and well care, proper use of emergency dept and after hours care, and addressed their concerns.    Health screening: Advised they see their eye doctor yearly for routine vision care. Advised they see their dentist yearly for routine dental care including hygiene visits twice yearly. Advised routine gynecology visits  Cancer screening Counseled on self breast exams, mammograms, cervical cancer screening  reviewed recent pap smear.  Colonoscopy:  Reviewed colonoscopy on file that is up to date   Vaccinations: Advised yearly influenza vaccine She just had covid #1 vaccine counseled on Tdap.  She will  check her vaccine records first.    Reviewed recent labs in chart records.  TSH normal 01/2020 Pap normal including neg HPV 01/2020 CMET,CBC , folate, iron, CRP, quantiferon gold all normal 12/2019  Negative for Hep B and C 12/2017 per serology.    Ameia was seen today for annual exam.  Diagnoses and all orders for this visit:  Encounter for health maintenance examination in adult -     Lipid panel -     VITAMIN D 25 Hydroxy (Vit-D Deficiency, Fractures)  Screening for lipid disorders -     Lipid panel  Crohn's disease with complication, unspecified gastrointestinal tract location [facility])  Vaccine counseling  High risk medication use    Follow-up pending labs, yearly for physical

## 2020-01-12 LAB — LIPID PANEL
Chol/HDL Ratio: 1.9 ratio (ref 0.0–4.4)
Cholesterol, Total: 191 mg/dL (ref 100–199)
HDL: 98 mg/dL (ref >39)
LDL Chol Calc (NIH): 78 mg/dL (ref 0–99)
Triglycerides: 87 mg/dL (ref 0–149)
VLDL Cholesterol Cal: 15 mg/dL (ref 5–40)

## 2020-01-12 LAB — VITAMIN D 25 HYDROXY (VIT D DEFICIENCY, FRACTURES): Vit D, 25-Hydroxy: 50.2 ng/mL (ref 30.0–100.0)

## 2020-01-16 ENCOUNTER — Telehealth: Payer: Self-pay | Admitting: Gastroenterology

## 2020-01-16 NOTE — Telephone Encounter (Signed)
No answer then line rings busy

## 2020-01-16 NOTE — Telephone Encounter (Signed)
Patient calling- states she has a question regarding her Humira.

## 2020-01-17 NOTE — Telephone Encounter (Signed)
Patient returned your call, please call patient one more time.

## 2020-01-17 NOTE — Telephone Encounter (Signed)
Called back listed number. No answer. Got voicemail. Left a message to call back and ask for Beth.

## 2020-01-18 NOTE — Telephone Encounter (Signed)
Called. No answer. Left a message on the voicemail.

## 2020-01-22 NOTE — Telephone Encounter (Signed)
Patient sent her question via My Chart.

## 2020-02-03 ENCOUNTER — Other Ambulatory Visit: Payer: Self-pay | Admitting: Gastroenterology

## 2020-03-30 ENCOUNTER — Other Ambulatory Visit: Payer: Self-pay | Admitting: Gastroenterology

## 2020-04-02 ENCOUNTER — Other Ambulatory Visit: Payer: Self-pay | Admitting: Gastroenterology

## 2020-06-12 ENCOUNTER — Telehealth: Payer: Self-pay

## 2020-06-12 NOTE — Telephone Encounter (Signed)
PA form for Humira received.  Completed the form and faxed it back.

## 2020-07-06 ENCOUNTER — Other Ambulatory Visit: Payer: Self-pay | Admitting: Gastroenterology

## 2020-10-10 ENCOUNTER — Other Ambulatory Visit: Payer: Self-pay | Admitting: Gastroenterology

## 2020-10-23 ENCOUNTER — Ambulatory Visit: Payer: BC Managed Care – PPO | Admitting: Gastroenterology

## 2020-10-23 ENCOUNTER — Encounter: Payer: Self-pay | Admitting: Gastroenterology

## 2020-10-23 VITALS — BP 119/64 | HR 73 | Ht 66.0 in | Wt 170.2 lb

## 2020-10-23 DIAGNOSIS — K5000 Crohn's disease of small intestine without complications: Secondary | ICD-10-CM

## 2020-10-23 DIAGNOSIS — K581 Irritable bowel syndrome with constipation: Secondary | ICD-10-CM | POA: Diagnosis not present

## 2020-10-23 MED ORDER — DICYCLOMINE HCL 20 MG PO TABS: 20.0000 mg | ORAL_TABLET | Freq: Three times a day (TID) | ORAL | 11 refills | Status: DC | PRN

## 2020-10-23 MED ORDER — LINACLOTIDE 290 MCG PO CAPS: 290.0000 ug | ORAL_CAPSULE | Freq: Every day | ORAL | 2 refills | Status: DC

## 2020-10-23 NOTE — Progress Notes (Signed)
Lacey Hess    [id number]    November 29, 1980  Primary Care Physician:Tysinger, Camelia Eng, PA-C  Referring Physician: Carlena Hurl, PA-C [address] [place],  [place] [address]   Chief complaint: Crohn's disease  HPI:  39 year old very pleasant female with Crohn's disease here for follow-up visit  She has intermittent abdominal bloating and right lower quadrant discomfort once every few weeks, she changes her diet and her symptoms improve.  She feels better when she has regular bowel movements.  She feels she will likely need higher dose lenses as she is not having effective response with Linzess 145 mcg daily.  She was doing better when she was working from home, her bowel habits were more regular but since she had to return to work she feels her symptoms have worsened.  She is having intermittent headaches, is unsure if it is related to Humira or not  Denies any nausea, vomiting, abdominal pain, melena or bright red blood per rectum  GI history: On Humira biweekly injection. Humira antibody level detected at 28 and drug level 16 on September 06, 2018.  Colonoscopy December 06, 2017 showed multiple aphthous ulcers in terminal ileum, biopsy consistent with erosions and active inflammation. CT enterography April 2019 with contrast showed mild wall thickening and abnormal mucosal enhancement in terminal ileum highly suspicious for Crohn's disease.   Outpatient Encounter Medications as of 10/23/2020  Medication Sig  . acetaminophen (TYLENOL) 500 MG tablet Take 500-1,000 mg by mouth every 8 (eight) hours as needed (for pain).   . AMBULATORY NON FORMULARY MEDICATION Medication Name: Nitroglycerin Ointment 0.125% use pea sized amount per rectum three times a day for 6-8 weeks  . cyanocobalamin (,VITAMIN B-12,) 1000 MCG/ML injection ADMINISTER 1 ML(1000 MCG) IN THE MUSCLE EVERY 30 DAYS  . etonogestrel-ethinyl estradiol (NUVARING) 0.12-0.015 MG/24HR vaginal ring Place  1 each vaginally every 28 (twenty-eight) days. Insert vaginally and leave in place for 3 consecutive weeks, then remove for 1 week.  Marland Kitchen HUMIRA PEN 40 MG/0.4ML PNKT INJECT 1 PEN UNDER THE SKIN EVERY 14 DAYS.  Marland Kitchen LINZESS 145 MCG CAPS capsule TAKE 1 CAPSULE(145 MCG) BY MOUTH DAILY BEFORE BREAKFAST  . TUBERCULIN SYR 1CC/26GX3/8" (B-D TB SYRINGE 1CC/26GX3/8") 26G X 3/8" 1 ML MISC Use every 4 weeks to administer Vitamin B12 injection IM   No facility-administered encounter medications on file as of 10/23/2020.    Allergies as of 10/23/2020 - Review Complete 10/23/2020  Allergen Reaction Noted  . Adhesive [tape] Rash 08/29/2018    Past Medical History:  Diagnosis Date  . B12 deficiency 2019  . Crohn's disease ([place]) 12/2017    Past Surgical History:  Procedure Laterality Date  . BREAST LUMPECTOMY Left    age 49yo  . COLONOSCOPY  12/2017   mult ulcers in terminal ileum, othewise normal colon.  Dr. Harl Bowie    Family History  Problem Relation Age of Onset  . Diabetes Maternal Aunt   . Crohn's disease Mother   . Hepatitis Father   . Cancer Maternal Grandmother        lung  . Drug abuse Brother   . Heart disease Paternal Aunt        CHF  . Cancer Paternal Grandmother        breast  . Heart disease Paternal Grandmother        CHF  . Colon cancer Neg Hx   . Esophageal cancer Neg Hx   . Rectal cancer Neg  Hx     Social History   Socioeconomic History  . Marital status: Married    Spouse name: Not on file  . Number of children: 0  . Years of education: Not on file  . Highest education level: Not on file  Occupational History  . Occupation: [employer]  Tobacco Use  . Smoking status: Never Smoker  . Smokeless tobacco: Never Used  Vaping Use  . Vaping Use: Never used  Substance and Sexual Activity  . Alcohol use: Yes    Comment: 2-3 glasses weekly   . Drug use: No  . Sexual activity: Not on file  Other Topics Concern  . Not on file  Social History Narrative   Works at  [employer], Scientist, physiological of Students, 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  Exercise - peloton, yoga, weights.  01/2020.     Social Determinants of Health   Financial Resource Strain: Not on file  Food Insecurity: Not on file  Transportation Needs: Not on file  Physical Activity: Not on file  Stress: Not on file  Social Connections: Not on file  Intimate Partner Violence: Not on file      Review of systems: All other review of systems negative except as mentioned in the HPI.   Physical Exam: Vitals:   10/23/20 0954  BP: 119/64  Pulse: 73  SpO2: 98%   Body mass index is 27.47 kg/m. Gen:      No acute distress HEENT:  sclera anicteric Abd:      soft, non-tender; no palpable masses, no distension Ext:    No edema Neuro: alert and oriented x 3 Psych: normal mood and affect  Data Reviewed:  Reviewed labs, radiology imaging, old records and pertinent past GI work up   Assessment and Plan/Recommendations:  39 year old very pleasant female with history of Crohn's disease, predominantly involving terminal ileum chronic immunosuppressive therapy with Humira   Check CBC, CMP, CRP, B12, TB QuantiFERON gold, folate and iron panel for IBD maintenance  Intermittent right lower quadrant discomfort and abdominal bloating could be secondary to chronic idiopathic constipation versus Crohn's disease Check Humira drug trough and antibody level for drug monitoring  Chronic idiopathic constipation: Increase dose to 290 mcg daily  Abdominal discomfort and bloating: Trial of dicyclomine 20 mg every 8 hours as needed  Return in 6 months or sooner if needed   The patient was provided an opportunity to ask questions and all were answered. The patient agreed with the plan and demonstrated an understanding of the instructions.  Damaris Hippo , MD    CC: Tysinger, Camelia Eng, PA-C

## 2020-10-23 NOTE — Patient Instructions (Signed)
We have sent the following medications to your pharmacy for you to pick up at your convenience: Linzess 251m- once daily. Samples also given today. , Bentyl   START: Bentyl 257m- 1 capsule by mouth every 8 hrs as needed.   Your provider has requested that you go to the basement level for lab work in Jan 2022. Press "B" on the elevator. The lab is located at the first door on the left as you exit the elevator.   If you are age 2572r younger, your body mass index should be between 19-25. Your Body mass index is 27.47 kg/m. If this is out of the aformentioned range listed, please consider follow up with your Primary Care Provider.   Follow- up in 6 months. Sooner if needed.   Thank you for choosing me and [facility].  Dr. NaSilverio Decamp

## 2020-11-01 ENCOUNTER — Other Ambulatory Visit: Payer: Self-pay | Admitting: Gastroenterology

## 2020-11-01 ENCOUNTER — Encounter: Payer: Self-pay | Admitting: Gastroenterology

## 2020-11-25 ENCOUNTER — Telehealth: Payer: Self-pay | Admitting: Gastroenterology

## 2020-11-25 ENCOUNTER — Other Ambulatory Visit (INDEPENDENT_AMBULATORY_CARE_PROVIDER_SITE_OTHER): Payer: BC Managed Care – PPO

## 2020-11-25 DIAGNOSIS — K5000 Crohn's disease of small intestine without complications: Secondary | ICD-10-CM | POA: Diagnosis not present

## 2020-11-25 DIAGNOSIS — K581 Irritable bowel syndrome with constipation: Secondary | ICD-10-CM

## 2020-11-25 LAB — VITAMIN B12: Vitamin B-12: 289 pg/mL (ref 211–911)

## 2020-11-25 LAB — C-REACTIVE PROTEIN: CRP: 1.3 mg/dL (ref 0.5–20.0)

## 2020-11-25 LAB — CBC WITH DIFFERENTIAL/PLATELET
Basophils Absolute: 0.1 10*3/uL (ref 0.0–0.1)
Basophils Relative: 0.7 % (ref 0.0–3.0)
Eosinophils Absolute: 0 10*3/uL (ref 0.0–0.7)
Eosinophils Relative: 0.2 % (ref 0.0–5.0)
HCT: 37.7 % (ref 36.0–46.0)
Hemoglobin: 12.8 g/dL (ref 12.0–15.0)
Lymphocytes Relative: 25.6 % (ref 12.0–46.0)
Lymphs Abs: 2 10*3/uL (ref 0.7–4.0)
MCHC: 33.9 g/dL (ref 30.0–36.0)
MCV: 91.3 fl (ref 78.0–100.0)
Monocytes Absolute: 0.4 10*3/uL (ref 0.1–1.0)
Monocytes Relative: 5.7 % (ref 3.0–12.0)
Neutro Abs: 5.2 10*3/uL (ref 1.4–7.7)
Neutrophils Relative %: 67.8 % (ref 43.0–77.0)
Platelets: 269 10*3/uL (ref 150.0–400.0)
RBC: 4.13 Mil/uL (ref 3.87–5.11)
RDW: 12.3 % (ref 11.5–15.5)
WBC: 7.6 10*3/uL (ref 4.0–10.5)

## 2020-11-25 LAB — COMPREHENSIVE METABOLIC PANEL
ALT: 11 U/L (ref 0–35)
AST: 12 U/L (ref 0–37)
Albumin: 4.1 g/dL (ref 3.5–5.2)
Alkaline Phosphatase: 61 U/L (ref 39–117)
BUN: 10 mg/dL (ref 6–23)
CO2: 26 mEq/L (ref 19–32)
Calcium: 9.6 mg/dL (ref 8.4–10.5)
Chloride: 103 mEq/L (ref 96–112)
Creatinine, Ser: 0.83 mg/dL (ref 0.40–1.20)
GFR: 89.95 mL/min (ref >60.00)
Glucose, Bld: 73 mg/dL (ref 70–99)
Potassium: 3.9 mEq/L (ref 3.5–5.1)
Sodium: 138 mEq/L (ref 135–145)
Total Bilirubin: 0.4 mg/dL (ref 0.2–1.2)
Total Protein: 7.1 g/dL (ref 6.0–8.3)

## 2020-11-25 LAB — SEDIMENTATION RATE: Sed Rate: 34 mm/hr — ABNORMAL HIGH (ref 0–20)

## 2020-11-25 LAB — FERRITIN: Ferritin: 15.6 ng/mL (ref 10.0–291.0)

## 2020-11-25 NOTE — Telephone Encounter (Signed)
Pt is requesting a call back regarding her lab results.

## 2020-11-26 NOTE — Telephone Encounter (Signed)
Spoke with the patient about her elevated Sed rate. She denies any upper respiratory symptoms, urinary symptoms or any other signs or infection.  She will begin taking her B12 injections every 21 days. Awaiting the drug level test results.

## 2020-11-26 NOTE — Telephone Encounter (Signed)
Ok thanks 

## 2020-11-27 LAB — QUANTIFERON-TB GOLD PLUS
Mitogen-NIL: >10 IU/mL
NIL: 0.03 IU/mL
QuantiFERON-TB Gold Plus: NEGATIVE
TB1-NIL: 0 IU/mL
TB2-NIL: 0.01 IU/mL

## 2020-11-30 LAB — SERIAL MONITORING

## 2020-12-02 LAB — ADALIMUMAB+AB (SERIAL MONITOR)
Adalimumab Drug Level: 12 ug/mL
Anti-Adalimumab Antibody: <25 ng/mL

## 2020-12-10 ENCOUNTER — Encounter: Payer: Self-pay | Admitting: Gastroenterology

## 2020-12-10 ENCOUNTER — Other Ambulatory Visit (INDEPENDENT_AMBULATORY_CARE_PROVIDER_SITE_OTHER): Payer: BC Managed Care – PPO

## 2020-12-10 ENCOUNTER — Ambulatory Visit: Payer: BC Managed Care – PPO | Admitting: Gastroenterology

## 2020-12-10 VITALS — BP 108/60 | HR 73 | Ht 65.0 in | Wt 171.0 lb

## 2020-12-10 DIAGNOSIS — K5000 Crohn's disease of small intestine without complications: Secondary | ICD-10-CM

## 2020-12-10 LAB — SEDIMENTATION RATE: Sed Rate: 12 mm/hr (ref 0–20)

## 2020-12-10 LAB — HIGH SENSITIVITY CRP: CRP, High Sensitivity: 4.97 mg/L (ref 0.000–5.000)

## 2020-12-10 NOTE — Patient Instructions (Signed)
Your provider has requested that you go to the basement level for lab work before leaving today. Press "B" on the elevator. The lab is located at the first door on the left as you exit the elevator.  Please follow up in three months

## 2020-12-10 NOTE — Progress Notes (Signed)
Lacey Hess    [id number]    [birth year]  Primary Care Physician:Tysinger, Camelia Eng, PA-C  Referring Physician: Carlena Hurl, PA-C [address] [place],  [place] [address]   Chief complaint: Crohn's disease  HPI:  40 year old very pleasant female with Crohn's disease here for follow-up visit  ESR elevated at 4, rest of the labs within normal range at baseline Adalimumab drug level therapeutic at 12 with no detectable antibodies  She tries to exercise on average 5 days a week, uses her Peloton bike also tries to run.  Denies any joint pain, UTI symptoms or URI symptoms.  Denies any recent change in her GI symptoms  She has intermittent abdominal bloating and right lower quadrant discomfort once every few weeks, she changes her diet and her symptoms improve.  She feels better when she has regular bowel movements.     Denies any nausea, vomiting, abdominal pain, melena or bright red blood per rectum  Mother was recently diagnosed with Crohn's disease and is currently on Entyvio infusion  GI history: On Humira biweekly injection. Humira antibody level detected at 28 and drug level 16 on September 06, 2018.  Colonoscopy December 06, 2017 showed multiple aphthous ulcers in terminal ileum, biopsy consistent with erosions and active inflammation. CT enterography April 2019 with contrast showed mild wall thickening and abnormal mucosal enhancement in terminal ileum highly suspicious for Crohn's disease.    Outpatient Encounter Medications as of 12/10/2020  Medication Sig  . acetaminophen (TYLENOL) 500 MG tablet Take 500-1,000 mg by mouth every 8 (eight) hours as needed (for pain).   . cyanocobalamin (,VITAMIN B-12,) 1000 MCG/ML injection ADMINISTER 1 ML(1000 MCG) IN THE MUSCLE EVERY 30 DAYS  . dicyclomine (BENTYL) 20 MG tablet Take 1 tablet (20 mg total) by mouth every 8 (eight) hours as needed for spasms.  Marland Kitchen etonogestrel-ethinyl estradiol (NUVARING)  0.12-0.015 MG/24HR vaginal ring Place 1 each vaginally every 28 (twenty-eight) days. Insert vaginally and leave in place for 3 consecutive weeks, then remove for 1 week.  Marland Kitchen HUMIRA PEN 40 MG/0.4ML PNKT INJECT 1 PEN UNDER THE SKIN EVERY 14 DAYS.  Marland Kitchen linaclotide (LINZESS) 290 MCG CAPS capsule Take 1 capsule (290 mcg total) by mouth daily before breakfast.  . TUBERCULIN SYR 1CC/26GX3/8" (B-D TB SYRINGE 1CC/26GX3/8") 26G X 3/8" 1 ML MISC Use every 4 weeks to administer Vitamin B12 injection IM  . AMBULATORY NON FORMULARY MEDICATION Medication Name: Nitroglycerin Ointment 0.125% use pea sized amount per rectum three times a day for 6-8 weeks (Patient not taking: Reported on 12/10/2020)   No facility-administered encounter medications on file as of 12/10/2020.    Allergies as of 12/10/2020 - Review Complete 12/10/2020  Allergen Reaction Noted  . Adhesive [tape] Rash 08/29/2018    Past Medical History:  Diagnosis Date  . B12 deficiency 2019  . Crohn's disease ([place]) 12/2017    Past Surgical History:  Procedure Laterality Date  . BREAST LUMPECTOMY Left    age 9yo  . COLONOSCOPY  12/2017   mult ulcers in terminal ileum, othewise normal colon.  Dr. Harl Bowie    Family History  Problem Relation Age of Onset  . Diabetes Maternal Aunt   . Crohn's disease Mother   . Hepatitis Father   . Cancer Maternal Grandmother        lung  . Drug abuse Brother   . Heart disease Paternal Aunt        CHF  . Cancer  Paternal Grandmother        breast  . Heart disease Paternal Grandmother        CHF  . Colon cancer Neg Hx   . Esophageal cancer Neg Hx   . Rectal cancer Neg Hx     Social History   Socioeconomic History  . Marital status: Married    Spouse name: Not on file  . Number of children: 0  . Years of education: Not on file  . Highest education level: Not on file  Occupational History  . Occupation: [employer]  Tobacco Use  . Smoking status: Never Smoker  . Smokeless tobacco: Never Used   Vaping Use  . Vaping Use: Never used  Substance and Sexual Activity  . Alcohol use: Yes    Comment: 2-3 glasses weekly   . Drug use: No  . Sexual activity: Not on file  Other Topics Concern  . 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  Exercise - peloton, yoga, weights.  01/2020.     Social Determinants of Health   Financial Resource Strain: Not on file  Food Insecurity: Not on file  Transportation Needs: Not on file  Physical Activity: Not on file  Stress: Not on file  Social Connections: Not on file  Intimate Partner Violence: Not on file      Review of systems: All other review of systems negative except as mentioned in the HPI.   Physical Exam: Vitals:   12/10/20 0811  BP: 108/60  Pulse: 73  SpO2: 99%   Body mass index is 28.46 kg/m. Gen:      No acute distress HEENT:  sclera anicteric Abd:      soft, non-tender; no palpable masses, no distension Ext:    No edema Neuro: alert and oriented x 3 Psych: normal mood and affect  Data Reviewed:  Reviewed labs, radiology imaging, old records and pertinent past GI work up   Assessment and Plan/Recommendations:  40 year old very pleasant female with history of Crohn's disease, predominantly involving terminal ileum  Mild elevation in sed rate, is nonspecific.  Unclear if she is having a mild flare of Crohn's disease or not.  Overall her symptoms are stable We will recheck ESR and CRP If continues to be significantly higher, will consider colonoscopy for further evaluation and based on findings may consider budesonide  Humira drug level is within therapeutic range with undetectable antibodies  Advised patient to call with any change in symptoms or worsening symptoms  Continue Linzess for chronic idiopathic constipation  Use dicyclomine 20 mg every 8 hours as needed for IBS with bloating and abdominal cramping  Return in 3  months or sooner if needed   The patient was provided an opportunity to ask questions and all were answered. The patient agreed with the plan and demonstrated an understanding of the instructions.  Damaris Hippo , MD    CC: Tysinger, Camelia Eng, PA-C

## 2020-12-11 ENCOUNTER — Other Ambulatory Visit: Payer: Self-pay

## 2020-12-11 DIAGNOSIS — K5000 Crohn's disease of small intestine without complications: Secondary | ICD-10-CM

## 2021-01-17 ENCOUNTER — Other Ambulatory Visit: Payer: Self-pay | Admitting: *Deleted

## 2021-01-17 ENCOUNTER — Encounter: Payer: Self-pay | Admitting: *Deleted

## 2021-01-17 MED ORDER — ETONOGESTREL-ETHINYL ESTRADIOL 0.12-0.015 MG/24HR VA RING: 1.0000 | VAGINAL_RING | VAGINAL | 2 refills | Status: DC

## 2021-01-21 ENCOUNTER — Other Ambulatory Visit: Payer: Self-pay | Admitting: Gastroenterology

## 2021-02-10 ENCOUNTER — Ambulatory Visit (INDEPENDENT_AMBULATORY_CARE_PROVIDER_SITE_OTHER): Payer: BC Managed Care – PPO | Admitting: Obstetrics and Gynecology

## 2021-02-10 ENCOUNTER — Encounter: Payer: Self-pay | Admitting: Obstetrics and Gynecology

## 2021-02-10 VITALS — BP 116/75 | HR 78 | Ht 65.0 in | Wt 171.5 lb

## 2021-02-10 DIAGNOSIS — Z01419 Encounter for gynecological examination (general) (routine) without abnormal findings: Secondary | ICD-10-CM

## 2021-02-10 MED ORDER — ETONOGESTREL-ETHINYL ESTRADIOL 0.12-0.015 MG/24HR VA RING: 1.0000 | VAGINAL_RING | VAGINAL | 3 refills | Status: DC

## 2021-02-10 MED ORDER — ETONOGESTREL-ETHINYL ESTRADIOL 0.12-0.015 MG/24HR VA RING: 1.0000 | VAGINAL_RING | VAGINAL | 2 refills | Status: DC

## 2021-02-10 NOTE — Progress Notes (Signed)
GYNECOLOGY ANNUAL PREVENTATIVE CARE ENCOUNTER NOTE  History:     Lacey Hess is a 40 y.o. Lacey Hess Kitchen female here for a routine annual gynecologic exam.  Current complaints: none.   Denies abnormal vaginal bleeding, discharge, pelvic pain, problems with intercourse or other gynecologic concerns.    Gynecologic History Patient's last menstrual period was 01/27/2021. Contraception: NuvaRing vaginal inserts Last Pap: 01/2020. Results were: normal with negative HPV Last mammogram:NA  Obstetric History OB History  No obstetric history on file.    Past Medical History:  Diagnosis Date  . Lacey Hess deficiency 2019  . Crohn's disease (Lacey Hess) 12/2017    Past Surgical History:  Procedure Laterality Date  . BREAST LUMPECTOMY Left    age 66yo  . COLONOSCOPY  12/2017   mult ulcers in terminal ileum, othewise normal colon.  Dr. Harl Hess    Current Outpatient Medications on File Prior to Visit  Medication Sig Dispense Refill  . acetaminophen (TYLENOL) 500 MG tablet Take 500-1,000 mg by mouth every 8 (eight) hours as needed (for pain).     . cyanocobalamin (,VITAMIN B-12,) 1000 MCG/ML injection ADMINISTER 1 ML(1000 MCG) IN THE MUSCLE EVERY 30 DAYS 1 mL 5  . dicyclomine (BENTYL) 20 MG tablet Take 1 tablet (20 mg total) by mouth every 8 (eight) hours as needed for spasms. 90 tablet 11  . etonogestrel-ethinyl estradiol (NUVARING) 0.12-0.015 MG/24HR vaginal ring Place 1 each vaginally every 28 (twenty-eight) days. Insert vaginally and leave in place for 3 consecutive weeks, then remove for 1 week. 1 each 2  . HUMIRA PEN 40 MG/0.4ML PNKT INJECT 1 PEN UNDER THE SKIN EVERY 14 DAYS. 2 each 10  . LINZESS 290 MCG CAPS capsule TAKE 1 CAPSULE(290 MCG) BY MOUTH DAILY BEFORE AND BREAKFAST 30 capsule 2  . TUBERCULIN SYR 1CC/26GX3/8" (B-D TB SYRINGE 1CC/26GX3/8") 26G X 3/8" 1 ML MISC Use every 4 weeks to administer Vitamin Lacey Hess injection IM 30 each 0  . AMBULATORY NON FORMULARY MEDICATION Medication Name:  Nitroglycerin Ointment 0.125% use pea sized amount per rectum three times a day for 6-8 weeks (Patient not taking: Reported on 02/10/2021) 30 g 2   No current facility-administered medications on file prior to visit.    Allergies  Allergen Reactions  . Adhesive [Tape] Rash    Social History:  reports that she has never smoked. She has never used smokeless tobacco. She reports current alcohol use. She reports that she does not use drugs.  Family History  Problem Relation Age of Onset  . Diabetes Maternal Aunt   . Crohn's disease Mother   . Hepatitis Father   . Cancer Maternal Grandmother        lung  . Drug abuse Brother   . Heart disease Paternal Aunt        CHF  . Cancer Paternal Grandmother        breast  . Heart disease Paternal Grandmother        CHF  . Colon cancer Neg Hx   . Esophageal cancer Neg Hx   . Rectal cancer Neg Hx     The following portions of the patient's history were reviewed and updated as appropriate: allergies, current medications, past family history, past medical history, past social history, past surgical history and problem list.  Review of Systems Pertinent items noted in HPI and remainder of comprehensive ROS otherwise negative.  Physical Exam:  BP 116/75   Pulse 78   Ht 5' 5"  (1.651 m)   Wt 171 lb  8 oz (77.8 kg)   LMP 01/27/2021   BMI 28.54 kg/m  CONSTITUTIONAL: Well-developed, well-nourished female in no acute distress.  HENT:  Normocephalic, atraumatic, External right and left ear normal.  EYES: Conjunctivae and EOM are normal. Pupils are equal, round, and reactive to light. No scleral icterus.  NECK: Normal range of motion, supple, no masses.  Normal thyroid.  SKIN: Skin is warm and dry. No rash noted. Not diaphoretic. No erythema. No pallor. MUSCULOSKELETAL: Normal range of motion. No tenderness.  No cyanosis, clubbing, or edema. NEUROLOGIC: Alert and oriented to person, place, and time. Normal reflexes, muscle tone coordination.   PSYCHIATRIC: Normal mood and affect. Normal behavior. Normal judgment and thought content. CARDIOVASCULAR: Normal heart rate noted, regular rhythm RESPIRATORY: Clear to auscultation bilaterally. Effort and breath sounds normal, no problems with respiration noted. BREASTS: Symmetric in size. No masses, tenderness, skin changes, nipple drainage, or lymphadenopathy bilaterally. Performed in the presence of a chaperone. ABDOMEN: Soft, no distention noted.  No tenderness, rebound or guarding.  PELVIC: Normal appearing external genitalia.  Normal uterine size, no other palpable masses, no uterine or adnexal tenderness.  Performed in the presence of a chaperone.    1. Women's annual routine gynecological examination  Refill on nuvaring Considering family planning in the next 1-2 years. Start prenatal vitamins Return in 1 year for annual without pap.     Will follow up results of pap smear and manage accordingly. Routine preventative health maintenance measures emphasized. Please refer to After Visit Summary for other counseling recommendations.     [name], Artist Pais, [place] for [employer], [place]

## 2021-02-10 NOTE — Progress Notes (Signed)
GYNECOLOGY ANNUAL PREVENTATIVE CARE ENCOUNTER NOTE  History:     Lacey Hess is a 40 y.o. No obstetric history on file. female here for a routine annual gynecologic exam.  Current complaints: none   Denies abnormal vaginal bleeding, discharge, pelvic pain, problems with intercourse or other gynecologic concerns.    Gynecologic History Patient's last menstrual period was 01/27/2021. Contraception: NuvaRing vaginal inserts Last Pap: 2021. Results were: normal with negative HPV Last mammogram: Na  Obstetric History OB History  No obstetric history on file.    Past Medical History:  Diagnosis Date  . B12 deficiency 2019  . Crohn's disease ([place]) 12/2017    Past Surgical History:  Procedure Laterality Date  . BREAST LUMPECTOMY Left    age 15yo  . COLONOSCOPY  12/2017   mult ulcers in terminal ileum, othewise normal colon.  Dr. Harl Bowie    Current Outpatient Medications on File Prior to Visit  Medication Sig Dispense Refill  . acetaminophen (TYLENOL) 500 MG tablet Take 500-1,000 mg by mouth every 8 (eight) hours as needed (for pain).     . cyanocobalamin (,VITAMIN B-12,) 1000 MCG/ML injection ADMINISTER 1 ML(1000 MCG) IN THE MUSCLE EVERY 30 DAYS 1 mL 5  . dicyclomine (BENTYL) 20 MG tablet Take 1 tablet (20 mg total) by mouth every 8 (eight) hours as needed for spasms. 90 tablet 11  . etonogestrel-ethinyl estradiol (NUVARING) 0.12-0.015 MG/24HR vaginal ring Place 1 each vaginally every 28 (twenty-eight) days. Insert vaginally and leave in place for 3 consecutive weeks, then remove for 1 week. 1 each 2  . HUMIRA PEN 40 MG/0.4ML PNKT INJECT 1 PEN UNDER THE SKIN EVERY 14 DAYS. 2 each 10  . LINZESS 290 MCG CAPS capsule TAKE 1 CAPSULE(290 MCG) BY MOUTH DAILY BEFORE AND BREAKFAST 30 capsule 2  . TUBERCULIN SYR 1CC/26GX3/8" (B-D TB SYRINGE 1CC/26GX3/8") 26G X 3/8" 1 ML MISC Use every 4 weeks to administer Vitamin B12 injection IM 30 each 0  . AMBULATORY NON FORMULARY  MEDICATION Medication Name: Nitroglycerin Ointment 0.125% use pea sized amount per rectum three times a day for 6-8 weeks (Patient not taking: Reported on 02/10/2021) 30 g 2   No current facility-administered medications on file prior to visit.    Allergies  Allergen Reactions  . Adhesive [Tape] Rash    Social History:  reports that she has never smoked. She has never used smokeless tobacco. She reports current alcohol use. She reports that she does not use drugs.  Family History  Problem Relation Age of Onset  . Diabetes Maternal Aunt   . Crohn's disease Mother   . Hepatitis Father   . Cancer Maternal Grandmother        lung  . Drug abuse Brother   . Heart disease Paternal Aunt        CHF  . Cancer Paternal Grandmother        breast  . Heart disease Paternal Grandmother        CHF  . Colon cancer Neg Hx   . Esophageal cancer Neg Hx   . Rectal cancer Neg Hx     The following portions of the patient's history were reviewed and updated as appropriate: allergies, current medications, past family history, past medical history, past social history, past surgical history and problem list.  Review of Systems Pertinent items noted in HPI and remainder of comprehensive ROS otherwise negative.  Physical Exam:  BP 116/75   Pulse 78   Ht 5' 5"  (1.651 m)  Wt 171 lb 8 oz (77.8 kg)   LMP 01/27/2021   BMI 28.54 kg/m  CONSTITUTIONAL: Well-developed, well-nourished female in no acute distress.  HENT:  Normocephalic, atraumatic, External right and left ear normal.  EYES: Conjunctivae and EOM are normal. Pupils are equal, round, and reactive to light. No scleral icterus.  NECK: Normal range of motion, supple, no masses.  Normal thyroid.  SKIN: Skin is warm and dry. No rash noted. Not diaphoretic. No erythema. No pallor. MUSCULOSKELETAL: Normal range of motion. No tenderness.  No cyanosis, clubbing, or edema. NEUROLOGIC: Alert and oriented to person, place, and time. Normal reflexes,  muscle tone coordination.  PSYCHIATRIC: Normal mood and affect. Normal behavior. Normal judgment and thought content. CARDIOVASCULAR: Normal heart rate noted, regular rhythm RESPIRATORY: Clear to auscultation bilaterally. Effort and breath sounds normal, no problems with respiration noted. BREASTS: Symmetric in size. No masses, tenderness, skin changes, nipple drainage, or lymphadenopathy bilaterally. Performed in the presence of a chaperone. ABDOMEN: Soft, no distention noted.  No tenderness, rebound or guarding.  PELVIC: Normal appearing external genitalia and urethral meatus, Normal uterine size, no other palpable masses, no uterine or adnexal tenderness.  Performed in the presence of a chaperone.   Assessment and Plan:   1. Women's annual routine gynecological examination  Refill nuvaring Start prenatal vitamin daily (planning to start family after completion of doctorate degree) F/u 1 year.  Will follow up results of pap smear and manage accordingly. Routine preventative health maintenance measures emphasized. Please refer to After Visit Summary for other counseling recommendations.    Lacey Hess, Artist Pais, [place] for [employer], [place]

## 2021-03-06 ENCOUNTER — Other Ambulatory Visit: Payer: Self-pay | Admitting: Gastroenterology

## 2021-04-03 ENCOUNTER — Encounter: Payer: Self-pay | Admitting: Gastroenterology

## 2021-04-03 ENCOUNTER — Other Ambulatory Visit (INDEPENDENT_AMBULATORY_CARE_PROVIDER_SITE_OTHER): Payer: BC Managed Care – PPO

## 2021-04-03 ENCOUNTER — Ambulatory Visit: Payer: BC Managed Care – PPO | Admitting: Gastroenterology

## 2021-04-03 VITALS — BP 100/66 | HR 68 | Ht 65.5 in | Wt 170.5 lb

## 2021-04-03 DIAGNOSIS — K5000 Crohn's disease of small intestine without complications: Secondary | ICD-10-CM

## 2021-04-03 DIAGNOSIS — R109 Unspecified abdominal pain: Secondary | ICD-10-CM | POA: Diagnosis not present

## 2021-04-03 DIAGNOSIS — K5904 Chronic idiopathic constipation: Secondary | ICD-10-CM

## 2021-04-03 LAB — CBC WITH DIFFERENTIAL/PLATELET
Basophils Absolute: 0 10*3/uL (ref 0.0–0.1)
Basophils Relative: 0.7 % (ref 0.0–3.0)
Eosinophils Absolute: 0 10*3/uL (ref 0.0–0.7)
Eosinophils Relative: 0.5 % (ref 0.0–5.0)
HCT: 39.9 % (ref 36.0–46.0)
Hemoglobin: 13.3 g/dL (ref 12.0–15.0)
Lymphocytes Relative: 44.1 % (ref 12.0–46.0)
Lymphs Abs: 2.7 10*3/uL (ref 0.7–4.0)
MCHC: 33.2 g/dL (ref 30.0–36.0)
MCV: 93.9 fl (ref 78.0–100.0)
Monocytes Absolute: 0.4 10*3/uL (ref 0.1–1.0)
Monocytes Relative: 6.7 % (ref 3.0–12.0)
Neutro Abs: 2.9 10*3/uL (ref 1.4–7.7)
Neutrophils Relative %: 48 % (ref 43.0–77.0)
Platelets: 219 10*3/uL (ref 150.0–400.0)
RBC: 4.25 Mil/uL (ref 3.87–5.11)
RDW: 12.9 % (ref 11.5–15.5)
WBC: 6.1 10*3/uL (ref 4.0–10.5)

## 2021-04-03 LAB — FOLATE: Folate: 22.9 ng/mL (ref >5.9)

## 2021-04-03 LAB — VITAMIN B12: Vitamin B-12: 374 pg/mL (ref 211–911)

## 2021-04-03 LAB — VITAMIN D 25 HYDROXY (VIT D DEFICIENCY, FRACTURES): VITD: 43.58 ng/mL (ref 30.00–100.00)

## 2021-04-03 MED ORDER — LINACLOTIDE 290 MCG PO CAPS: ORAL_CAPSULE | ORAL | 2 refills | Status: DC

## 2021-04-03 NOTE — Patient Instructions (Addendum)
Your provider has requested that you go to the basement level for lab work before leaving today. Press "B" on the elevator. The lab is located at the first door on the left as you exit the elevator.  We will send Linzess refills to your pharmacy  Follow up in 6 months  Due to recent changes in healthcare laws, you may see the results of your imaging and laboratory studies on MyChart before your provider has had a chance to review them.  We understand that in some cases there may be results that are confusing or concerning to you. Not all laboratory results come back in the same time frame and the provider may be waiting for multiple results in order to interpret others.  Please give [place] 48 hours in order for your provider to thoroughly review all the results before contacting the office for clarification of your results.   If you are age 18 or older, your body mass index should be between 23-30. Your Body mass index is 27.94 kg/m. If this is out of the aforementioned range listed, please consider follow up with your Primary Care Provider.  If you are age 38 or younger, your body mass index should be between 19-25. Your Body mass index is 27.94 kg/m. If this is out of the aformentioned range listed, please consider follow up with your Primary Care Provider.   __________________________________________________________  The [facility] providers would like to encourage you to use [facility] to communicate with providers for non-urgent requests or questions.  Due to long hold times on the telephone, sending your provider a message by [facility] may be a faster and more efficient way to get a response.  Please allow 48 business hours for a response.  Please remember that this is for non-urgent requests.   I appreciate the  opportunity to care for you  Thank You   Harl Bowie , MD

## 2021-04-03 NOTE — Progress Notes (Signed)
Lacey Hess    [id number]    [birth year]  Primary Care Physician:Tysinger, Camelia Eng, PA-C  Referring Physician: Carlena Hurl, PA-C [address] [place],  [place] [address]   Chief complaint: Crohn's disease  HPI:  40 year old very pleasant female with Crohn's disease here for follow-up visit    Denies any recent change in her GI symptoms   She has intermittent abdominal bloating and right lower quadrant discomfort once every few weeks, she changes her diet and her symptoms improve.  She feels better when she has regular bowel movements.    She is experiencing constipation with irregular bowel habits   Denies any nausea, vomiting, abdominal pain, melena or bright red blood per rectum   Mother was recently diagnosed with Crohn's disease and is currently on Entyvio infusion   GI history: On Humira biweekly injection.    Humira antibody level detected at 28 and drug level 16 on September 06, 2018.    Colonoscopy December 06, 2017 showed multiple aphthous ulcers in terminal ileum, biopsy consistent with erosions and active inflammation. CT enterography April 2019 with contrast showed mild wall thickening and abnormal mucosal enhancement in terminal ileum highly suspicious for Crohn's disease.     Outpatient Encounter Medications as of 04/03/2021  Medication Sig   acetaminophen (TYLENOL) 500 MG tablet Take 500-1,000 mg by mouth every 8 (eight) hours as needed (for pain).    cyanocobalamin (,VITAMIN B-12,) 1000 MCG/ML injection ADMINISTER 1 ML(1000 MCG) IN THE MUSCLE EVERY 30 DAYS   dicyclomine (BENTYL) 20 MG tablet Take 1 tablet (20 mg total) by mouth every 8 (eight) hours as needed for spasms. (Patient not taking: Reported on 04/03/2021)   etonogestrel-ethinyl estradiol (NUVARING) 0.12-0.015 MG/24HR vaginal ring Place 1 each vaginally every 28 (twenty-eight) days. Insert vaginally and leave in place for 3 consecutive weeks, then remove for 1 week.   HUMIRA PEN 40  MG/0.4ML PNKT INJECT 1 PEN UNDER THE SKIN EVERY 14 DAYS.   LINZESS 290 MCG CAPS capsule TAKE 1 CAPSULE(290 MCG) BY MOUTH DAILY BEFORE AND BREAKFAST   TUBERCULIN SYR 1CC/26GX3/8" (B-D TB SYRINGE 1CC/26GX3/8") 26G X 3/8" 1 ML MISC Use every 4 weeks to administer Vitamin B12 injection IM   [DISCONTINUED] AMBULATORY NON FORMULARY MEDICATION Medication Name: Nitroglycerin Ointment 0.125% use pea sized amount per rectum three times a day for 6-8 weeks (Patient not taking: Reported on 02/10/2021)   No facility-administered encounter medications on file as of 04/03/2021.    Allergies as of 04/03/2021 - Review Complete 04/03/2021  Allergen Reaction Noted   Adhesive [tape] Rash 08/29/2018    Past Medical History:  Diagnosis Date   B12 deficiency 2019   Crohn's disease ([place]) 12/2017    Past Surgical History:  Procedure Laterality Date   BREAST LUMPECTOMY Left    age 89yo   COLONOSCOPY  12/2017   mult ulcers in terminal ileum, othewise normal colon.  Dr. Harl Bowie    Family History  Problem Relation Age of Onset   Diabetes Maternal Aunt    Crohn's disease Mother    Hepatitis Father    Cancer Maternal Grandmother        lung   Drug abuse Brother    Heart disease Paternal Aunt        CHF   Cancer Paternal Grandmother        breast   Heart disease Paternal Grandmother        CHF   Colon cancer  Neg Hx    Esophageal cancer Neg Hx    Rectal cancer Neg Hx     Social History   Socioeconomic History   Marital status: Married    Spouse name: Not on file   Number of children: 0   Years of education: Not on file   Highest education level: Not on file  Occupational History   Occupation: [employer]  Tobacco Use   Smoking status: Never Smoker   Smokeless tobacco: Never Used  Scientific laboratory technician Use: Never used  Substance and Sexual Activity   Alcohol use: Yes    Comment: 2-3 glasses weekly    Drug use: No   Sexual activity: Not on file  Other Topics Concern   Not on file   Social History Narrative   Works at [employer], 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  Exercise - peloton, yoga, weights.  01/2020.     Social Determinants of Health   Financial Resource Strain: Not on file  Food Insecurity: No Food Insecurity   Worried About Charity fundraiser in the Last Year: Never true   Ran Out of Food in the Last Year: Never true  Transportation Needs: No Transportation Needs   Lack of Transportation (Medical): No   Lack of Transportation (Non-Medical): No  Physical Activity: Not on file  Stress: Not on file  Social Connections: Not on file  Intimate Partner Violence: Not on file      Review of systems: All other review of systems negative except as mentioned in the HPI.   Physical Exam: Vitals:   04/03/21 0903  BP: 100/66  Pulse: 68   Body mass index is 27.94 kg/m. Gen:      No acute distress HEENT:  sclera anicteric Abd:      soft, non-tender; no palpable masses, no distension Ext:    No edema Neuro: alert and oriented x 3 Psych: normal mood and affect  Data Reviewed:  Reviewed labs, radiology imaging, old records and pertinent past GI work up   Assessment and Plan/Recommendations:  40 year old very pleasant female with history of Crohn's disease, predominantly involving terminal ileum   Mild elevation in sed rate in February 2022, is nonspecific.  Unclear if she was having a mild flare of Crohn's disease or not.  Overall her symptoms are stable We will recheck ESR and CRP If continues to be significantly higher, will consider colonoscopy for further evaluation and based on findings may consider budesonide   Humira drug level is within therapeutic range with undetectable antibodies, continue current regimen   Advised patient to call with any change in symptoms or worsening symptoms  IBD Health Care Maintenance: Up-to-date with vaccinations and TB screening Check iron panel, ferritin, B12 and  folate  Continue Linzess 290 mcg daily for chronic idiopathic constipation   Use dicyclomine 20 mg every 8 hours as needed for IBS with bloating and abdominal cramping   Return in 6 months or sooner if needed   The patient was provided an opportunity to ask questions and all were answered. The patient agreed with the plan and demonstrated an understanding of the instructions.  Damaris Hippo , MD    CC: Tysinger, Camelia Eng, PA-C

## 2021-04-04 LAB — COMPREHENSIVE METABOLIC PANEL
ALT: 13 U/L (ref 0–35)
AST: 16 U/L (ref 0–37)
Albumin: 4.5 g/dL (ref 3.5–5.2)
Alkaline Phosphatase: 51 U/L (ref 39–117)
BUN: 12 mg/dL (ref 6–23)
CO2: 21 mEq/L (ref 19–32)
Calcium: 10 mg/dL (ref 8.4–10.5)
Chloride: 103 mEq/L (ref 96–112)
Creatinine, Ser: 0.8 mg/dL (ref 0.40–1.20)
GFR: 93.78 mL/min (ref >60.00)
Glucose, Bld: 76 mg/dL (ref 70–99)
Potassium: 4.2 mEq/L (ref 3.5–5.1)
Sodium: 140 mEq/L (ref 135–145)
Total Bilirubin: 0.6 mg/dL (ref 0.2–1.2)
Total Protein: 7.7 g/dL (ref 6.0–8.3)

## 2021-04-04 LAB — IBC + FERRITIN
Ferritin: 19.9 ng/mL (ref 10.0–291.0)
Iron: 176 ug/dL — ABNORMAL HIGH (ref 42–145)
Saturation Ratios: 35.5 % (ref 20.0–50.0)
Transferrin: 354 mg/dL (ref 212.0–360.0)

## 2021-04-04 LAB — HIGH SENSITIVITY CRP: CRP, High Sensitivity: 1.92 mg/L (ref 0.000–5.000)

## 2021-04-08 ENCOUNTER — Ambulatory Visit: Payer: BC Managed Care – PPO | Admitting: Gastroenterology

## 2021-04-10 ENCOUNTER — Encounter: Payer: Self-pay | Admitting: Gastroenterology

## 2021-04-11 ENCOUNTER — Other Ambulatory Visit: Payer: Self-pay

## 2021-04-11 MED ORDER — CYANOCOBALAMIN 1000 MCG/ML IJ SOLN: INTRAMUSCULAR | 12 refills | Status: DC

## 2021-04-11 MED ORDER — "TUBERCULIN SYRINGE 26G X 3/8"" 1 ML MISC": 3 refills | Status: DC

## 2021-05-05 ENCOUNTER — Other Ambulatory Visit: Payer: Self-pay | Admitting: Gastroenterology

## 2021-06-19 ENCOUNTER — Telehealth: Payer: Self-pay

## 2021-06-19 NOTE — Telephone Encounter (Signed)
PA for continuation of Humira faxed to [place] [facility].

## 2021-07-23 ENCOUNTER — Other Ambulatory Visit: Payer: Self-pay

## 2021-07-23 MED ORDER — ETONOGESTREL-ETHINYL ESTRADIOL 0.12-0.015 MG/24HR VA RING: 1.0000 | VAGINAL_RING | VAGINAL | 4 refills | Status: DC

## 2021-07-23 NOTE — Telephone Encounter (Signed)
Received fax from [facility] for Rx refill request of Nuva Ring. Pt had annual exam in 01/2021 and noted by provider to continue to have [place] for 1 year. Rx sent to pharmacy on file. Pt aware through [employer].  Colletta Maryland, RN

## 2021-08-27 ENCOUNTER — Other Ambulatory Visit: Payer: Self-pay

## 2021-08-27 ENCOUNTER — Telehealth: Payer: Self-pay | Admitting: Gastroenterology

## 2021-08-27 MED ORDER — ONDANSETRON 4 MG PO TBDP: 4.0000 mg | ORAL_TABLET | Freq: Three times a day (TID) | ORAL | 0 refills | Status: DC | PRN

## 2021-08-27 NOTE — Telephone Encounter (Signed)
Please send Rx for Zofran 44m q8h ODT prn for nausea. If her symptoms worsen , she may need to come to ER for evaluation. Please check if we can bring her in for urgent visit with me or app next available appt. Thanks

## 2021-08-27 NOTE — Telephone Encounter (Signed)
Patient called again requesting someone call her about her issues.  Thank you.

## 2021-08-27 NOTE — Telephone Encounter (Signed)
Discussed with the patient. She agrees to this plan of care.

## 2021-08-27 NOTE — Telephone Encounter (Signed)
Sx's started yesterday. She reports she has burning in her mid-abdominal area that comes in waves. Intensity is persistent. She feels like she wants to vomit. Last vomited about an hour ago. She is only sipping water. She is not constipated. She had her typical loose stools after taking her Linzess last night. No bloody or mucous filled stools. She was in [place] last week. No known sick contacts. Afebrile. Negative at home COVID test.

## 2021-08-27 NOTE — Telephone Encounter (Signed)
Patient having symptoms since noon yesterday -- intense cramping, getting worse after dinner, and last night.  2 am starting feeling nauseated with vomiting - burning sensation in her stomach that comes in waves.  She states this feels really different from what she normally experiences.  Please call and advise.

## 2021-09-06 ENCOUNTER — Other Ambulatory Visit: Payer: Self-pay | Admitting: Gastroenterology

## 2021-10-06 ENCOUNTER — Other Ambulatory Visit (INDEPENDENT_AMBULATORY_CARE_PROVIDER_SITE_OTHER): Payer: BC Managed Care – PPO

## 2021-10-06 ENCOUNTER — Ambulatory Visit: Payer: BC Managed Care – PPO | Admitting: Gastroenterology

## 2021-10-06 ENCOUNTER — Encounter: Payer: Self-pay | Admitting: Gastroenterology

## 2021-10-06 VITALS — BP 100/50 | HR 60 | Ht 65.0 in | Wt 169.6 lb

## 2021-10-06 DIAGNOSIS — K5902 Outlet dysfunction constipation: Secondary | ICD-10-CM

## 2021-10-06 DIAGNOSIS — K5000 Crohn's disease of small intestine without complications: Secondary | ICD-10-CM

## 2021-10-06 DIAGNOSIS — K602 Anal fissure, unspecified: Secondary | ICD-10-CM | POA: Diagnosis not present

## 2021-10-06 LAB — COMPREHENSIVE METABOLIC PANEL
ALT: 11 U/L (ref 0–35)
AST: 13 U/L (ref 0–37)
Albumin: 4.2 g/dL (ref 3.5–5.2)
Alkaline Phosphatase: 42 U/L (ref 39–117)
BUN: 10 mg/dL (ref 6–23)
CO2: 27 mEq/L (ref 19–32)
Calcium: 9.5 mg/dL (ref 8.4–10.5)
Chloride: 104 mEq/L (ref 96–112)
Creatinine, Ser: 0.71 mg/dL (ref 0.40–1.20)
GFR: 107.84 mL/min (ref >60.00)
Glucose, Bld: 83 mg/dL (ref 70–99)
Potassium: 3.8 mEq/L (ref 3.5–5.1)
Sodium: 138 mEq/L (ref 135–145)
Total Bilirubin: 0.3 mg/dL (ref 0.2–1.2)
Total Protein: 7.1 g/dL (ref 6.0–8.3)

## 2021-10-06 LAB — HIGH SENSITIVITY CRP: CRP, High Sensitivity: 1.55 mg/L (ref 0.000–5.000)

## 2021-10-06 LAB — CBC WITH DIFFERENTIAL/PLATELET
Basophils Absolute: 0 10*3/uL (ref 0.0–0.1)
Basophils Relative: 0.5 % (ref 0.0–3.0)
Eosinophils Absolute: 0 10*3/uL (ref 0.0–0.7)
Eosinophils Relative: 0.4 % (ref 0.0–5.0)
HCT: 38 % (ref 36.0–46.0)
Hemoglobin: 12.5 g/dL (ref 12.0–15.0)
Lymphocytes Relative: 50.6 % — ABNORMAL HIGH (ref 12.0–46.0)
Lymphs Abs: 2.9 10*3/uL (ref 0.7–4.0)
MCHC: 32.9 g/dL (ref 30.0–36.0)
MCV: 94.3 fl (ref 78.0–100.0)
Monocytes Absolute: 0.4 10*3/uL (ref 0.1–1.0)
Monocytes Relative: 7.4 % (ref 3.0–12.0)
Neutro Abs: 2.4 10*3/uL (ref 1.4–7.7)
Neutrophils Relative %: 41.1 % — ABNORMAL LOW (ref 43.0–77.0)
Platelets: 240 10*3/uL (ref 150.0–400.0)
RBC: 4.03 Mil/uL (ref 3.87–5.11)
RDW: 13 % (ref 11.5–15.5)
WBC: 5.8 10*3/uL (ref 4.0–10.5)

## 2021-10-06 LAB — FOLATE: Folate: 16.4 ng/mL (ref >5.9)

## 2021-10-06 LAB — IBC + FERRITIN
Ferritin: 11.8 ng/mL (ref 10.0–291.0)
Iron: 89 ug/dL (ref 42–145)
Saturation Ratios: 18.7 % — ABNORMAL LOW (ref 20.0–50.0)
TIBC: 476 ug/dL — ABNORMAL HIGH (ref 250.0–450.0)
Transferrin: 340 mg/dL (ref 212.0–360.0)

## 2021-10-06 LAB — SEDIMENTATION RATE: Sed Rate: 6 mm/hr (ref 0–20)

## 2021-10-06 LAB — VITAMIN B12: Vitamin B-12: 388 pg/mL (ref 211–911)

## 2021-10-06 MED ORDER — AMBULATORY NON FORMULARY MEDICATION: 0 refills | Status: DC

## 2021-10-06 MED ORDER — LINACLOTIDE 145 MCG PO CAPS: 145.0000 ug | ORAL_CAPSULE | Freq: Every day | ORAL | 2 refills | Status: DC

## 2021-10-06 NOTE — Patient Instructions (Addendum)
Your provider has requested that you go to the basement level for lab work before leaving today. Press "B" on the elevator. The lab is located at the first door on the left as you exit the elevator.   We have sent a prescription for nitroglycerin 0.125% gel to [facility]. You should apply a pea size amount to your rectum three times daily x 6-8 weeks.  [facility]'s information is below: Address: [address], [place], [place] [address]  [phone]  *Please DO NOT go directly from our office to pick up this medication! Give the pharmacy 1 day to process the prescription as this is compounded and takes time to make.  Please purchase the following medications over the counter and take as directed: Benefiber 1 tablespoon twice daily  We have sent the following medications to your pharmacy for you to pick up at your convenience: Linzess 145 mcg daily  Please follow up with Dr Silverio Decamp on Tuesday, 12/09/21 at 8:30 am.  You have been referred to the pelvic floor physical therapist. If you do not hear from their office in 2 weeks, please let us know!

## 2021-10-06 NOTE — Progress Notes (Signed)
Lacey Hess    [id number]    1981/06/17  Primary Care Physician:Tysinger, Camelia Eng, PA-C  Referring Physician: Carlena Hurl, PA-C [address] [place],   [address]   Chief complaint:  Fecal incontinence  HPI:  40 year old very pleasant female with Crohn's disease here for follow-up visit  She has one or 2 episodes of fecal incontinence in the month  She cant tell the difference between gas and  diarrhea  She has episodes  of alternating constipation for 3-4 days followed by diarrhea 3-4 bowel movements    She has intermittent abdominal bloating and right lower quadrant discomfort once every few weeks, she changes her diet and her symptoms improve.  She feels better when she has regular bowel movements.       Denies any nausea, vomiting, melena or bright red blood per rectum   Mother was recently diagnosed with Crohn's disease and and has ileostomy with high output.  She is worried if she ever has to undergo a colostomy or an ileostomy   GI history: On Humira biweekly injection.    Humira antibody level detected at 28 and drug level 16 on September 06, 2018.    Colonoscopy December 06, 2017 showed multiple aphthous ulcers in terminal ileum, biopsy consistent with erosions and active inflammation. CT enterography April 2019 with contrast showed mild wall thickening and abnormal mucosal enhancement in terminal ileum highly suspicious for Crohn's disease.   Outpatient Encounter Medications as of 10/06/2021  Medication Sig   acetaminophen (TYLENOL) 500 MG tablet Take 500-1,000 mg by mouth every 8 (eight) hours as needed (for pain).    cyanocobalamin (,VITAMIN B-12,) 1000 MCG/ML injection ADMINISTER 1 ML(1000 MCG) IN THE MUSCLE EVERY 30 DAYS   etonogestrel-ethinyl estradiol (NUVARING) 0.12-0.015 MG/24HR vaginal ring Place 1 each vaginally every 28 (twenty-eight) days. Insert vaginally and leave in place for 3 consecutive weeks, then remove for 1 week.    HUMIRA PEN 40 MG/0.4ML PNKT INJECT 1 PEN UNDER THE SKIN EVERY 14 DAYS.   LINZESS 290 MCG CAPS capsule TAKE 1 CAPSULE(290 MCG) BY MOUTH DAILY BEFORE BREAKFAST   TUBERCULIN SYR 1CC/26GX3/8" (B-D TB SYRINGE 1CC/26GX3/8") 26G X 3/8" 1 ML MISC Use every 3 weeks to administer Vitamin B12 injection IM   ondansetron (ZOFRAN ODT) 4 MG disintegrating tablet Take 1 tablet (4 mg total) by mouth every 8 (eight) hours as needed for nausea or vomiting. (Patient not taking: Reported on 10/06/2021)   [DISCONTINUED] dicyclomine (BENTYL) 20 MG tablet Take 1 tablet (20 mg total) by mouth every 8 (eight) hours as needed for spasms.   No facility-administered encounter medications on file as of 10/06/2021.    Allergies as of 10/06/2021 - Review Complete 10/06/2021  Allergen Reaction Noted   Adhesive [tape] Rash 08/29/2018    Past Medical History:  Diagnosis Date   B12 deficiency 2019   Crohn's disease ([place]) 12/2017    Past Surgical History:  Procedure Laterality Date   BREAST LUMPECTOMY Left    age 56yo   COLONOSCOPY  12/2017   mult ulcers in terminal ileum, othewise normal colon.  Dr. Harl Bowie    Family History  Problem Relation Age of Onset   Diabetes Maternal Aunt    Crohn's disease Mother    Hepatitis Father    Cancer Maternal Grandmother        lung   Drug abuse Brother    Heart disease Paternal Aunt  CHF   Cancer Paternal Grandmother        breast   Heart disease Paternal Grandmother        CHF   Colon cancer Neg Hx    Esophageal cancer Neg Hx    Rectal cancer Neg Hx     Social History   Socioeconomic History   Marital status: Married    Spouse name: Not on file   Number of children: 0   Years of education: Not on file   Highest education level: Not on file  Occupational History   Occupation: [employer]  Tobacco Use   Smoking status: Never   Smokeless tobacco: Never  Vaping Use   Vaping Use: Never used  Substance and Sexual Activity   Alcohol use: Yes    Comment:  2-3 glasses weekly    Drug use: No   Sexual activity: Not on file  Other Topics Concern   Not on file  Social History Narrative   Works at [employer], 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  Exercise - peloton, yoga, weights.  01/2020.     Social Determinants of Health   Financial Resource Strain: Not on file  Food Insecurity: No Food Insecurity   Worried About Charity fundraiser in the Last Year: Never true   Ran Out of Food in the Last Year: Never true  Transportation Needs: No Transportation Needs   Lack of Transportation (Medical): No   Lack of Transportation (Non-Medical): No  Physical Activity: Not on file  Stress: Not on file  Social Connections: Not on file  Intimate Partner Violence: Not on file      Review of systems: All other review of systems negative except as mentioned in the HPI.   Physical Exam: Vitals:   10/06/21 1335  BP: (!) 100/50  Pulse: 60   Body mass index is 28.22 kg/m. Gen:      No acute distress HEENT:  sclera anicteric Abd:      soft, non-tender; no palpable masses, no distension Ext:    No edema Neuro: alert and oriented x 3 Psych: normal mood and affect  Data Reviewed:  Reviewed labs, radiology imaging, old records and pertinent past GI work up   Assessment and Plan/Recommendations:  40 year old very pleasant female with history of Crohn's disease, predominantly involving terminal ileum  Intermittent fecal incontinence with worsening bowel habits Evidence of anal fissure on rectal exam, possibly leading to incomplete evacuation and fecal seepage  Use 0.125% small pea-sized amount of nitroglycerin per rectum 3-4 times daily for 6 to 8 weeks Benefiber 1 teaspoon 3 times daily with meals  Follow-up CBC, CMP, ESR, CRP, iron panel, B12   If continues to have persistent symptoms, will consider colonoscopy for further evaluation and based on findings may consider budesonide   Humira drug level is within  therapeutic range in January 2022 with undetectable antibodies, continue current regimen   Advised patient to call with any change in symptoms or worsening symptoms   IBD Health Care Maintenance: Up-to-date with vaccinations and TB screening Check iron panel, ferritin, B12 and folate   Decrease Linzess dose to 145 mcg daily for chronic idiopathic constipation   Use dicyclomine 20 mg every 8 hours as needed for IBS with bloating and abdominal cramping   Return in 2 to 3 months  This visit required 40 minutes of patient care (this includes precharting, chart review, review of results, face-to-face time used for counseling as well as  treatment plan and follow-up. The patient was provided an opportunity to ask questions and all were answered. The patient agreed with the plan and demonstrated an understanding of the instructions.  Damaris Hippo , MD    CC: Tysinger, Camelia Eng, PA-C

## 2021-10-16 ENCOUNTER — Ambulatory Visit: Payer: BC Managed Care – PPO | Admitting: Physical Therapy

## 2021-10-19 ENCOUNTER — Encounter: Payer: Self-pay | Admitting: Gastroenterology

## 2021-11-04 ENCOUNTER — Encounter: Payer: Self-pay | Admitting: Gastroenterology

## 2021-11-05 ENCOUNTER — Other Ambulatory Visit: Payer: Self-pay

## 2021-11-05 ENCOUNTER — Encounter: Payer: Self-pay | Admitting: Physical Therapy

## 2021-11-05 ENCOUNTER — Encounter: Payer: Self-pay | Admitting: Gastroenterology

## 2021-11-05 ENCOUNTER — Ambulatory Visit: Payer: BC Managed Care – PPO | Attending: Gastroenterology | Admitting: Physical Therapy

## 2021-11-05 DIAGNOSIS — R279 Unspecified lack of coordination: Secondary | ICD-10-CM | POA: Diagnosis not present

## 2021-11-05 DIAGNOSIS — R252 Cramp and spasm: Secondary | ICD-10-CM

## 2021-11-05 DIAGNOSIS — M6281 Muscle weakness (generalized): Secondary | ICD-10-CM | POA: Insufficient documentation

## 2021-11-05 DIAGNOSIS — K5902 Outlet dysfunction constipation: Secondary | ICD-10-CM | POA: Insufficient documentation

## 2021-11-05 NOTE — Therapy (Signed)
[facility] @ [place], [place], [address] Phone: [phone]   Fax:  [phone]  Physical Therapy Evaluation  Patient Details  Name: Lacey Hess MRN: [id number] Date of Birth: 02/20/1981 Referring Provider (PT): Mauri Pole   Encounter Date: 11/05/2021   PT End of Session - 11/05/21 0920     Visit Number 1    Date for PT Re-Evaluation 01/28/22    Authorization Type BCBS    PT Start Time 0800    PT Stop Time 0843    PT Time Calculation (min) 43 min    Activity Tolerance Patient tolerated treatment well    Behavior During Therapy [facility] for tasks assessed/performed             Past Medical History:  Diagnosis Date   B12 deficiency 2019   Crohn's disease ([place]) 12/2017    Past Surgical History:  Procedure Laterality Date   BREAST LUMPECTOMY Left    age 4yo   COLONOSCOPY  12/2017   mult ulcers in terminal ileum, othewise normal colon.  Dr. Harl Bowie    There were no vitals filed for this visit.    Subjective Assessment - 11/05/21 0806     Subjective Pt has Chrohn's and gets constipation and diarrhea.  I have to go sometimes and just cannot go.  I recenlty was told I have a fissure, but no symptoms from that.    Patient Stated Goals be able to go more easily    Currently in Pain? No/denies                [facility] PT Assessment - 11/05/21 0001       Assessment   Medical Diagnosis K59.02 (ICD-10-CM) - Constipation, outlet dysfunction    Referring Provider (PT) Harl Bowie V    Prior Therapy No      Precautions   Precautions None      Balance Screen   Has the patient fallen in the past 6 months No      [place] residence    Living Arrangements Spouse/significant other      Prior Function   Level of Independence Independent    Vocation Full time employment    Vocation Requirements sitting    Leisure work out on [employer]  4-5/week      Cognition   Overall Cognitive Status Within Functional Limits for tasks assessed      Functional Tests   Functional tests Squat;Single leg stance      Squat   Comments normal      Single Leg Stance   Comments normal      ROM / Strength   AROM / PROM / Strength AROM;PROM;Strength      AROM   Overall AROM Comments WFL      PROM   Overall PROM Comments Rt IR and hip flexion 30%; Lt hip IR and hip flexion 60%      Strength   Overall Strength Comments 5/5 bil hip      Flexibility   Soft Tissue Assessment /Muscle Length yes    Hamstrings Rt 70%; Lt 80%      Palpation   SI assessment  normal    Palpation comment lumbar and gluteals tight      Ambulation/Gait   Gait Pattern Within Functional Limits  Objective measurements completed on examination: See above findings.     Pelvic Floor Special Questions - 11/05/21 0001     Prior Pelvic/Prostate Exam Yes    Prior Pregnancies No    Currently Sexually Active Yes    Is this Painful No    Marinoff Scale discomfort that does not affect completion    Urinary Leakage Yes    How often sometimes- maybe monthly    Pad use wearing leak proof underwear    Activities that cause leaking --   just happens   Urinary urgency Yes   limit water on car rides   Urinary frequency JIC    Fecal incontinence Yes    Fluid intake at least 60, but 90 usually H2O    Prolapse None    Pelvic Floor Internal Exam pt identity confirmed and internal assessment performed    Exam Type Rectal    Sensation diminished    Palpation puborectalis tight and TTP; able to release very minimal with bulgle    Strength weak squeeze, no lift    Strength # of reps 1    Strength # of seconds 4    Tone high              [facility] Adult PT Treatment/Exercise - 11/05/21 0001       Posture/Postural Control   Posture/Postural Control No significant limitations      Self-Care   Self-Care Other Self-Care  Comments    Other Self-Care Comments  tennis ball massage, toileting, stretches, abdominal massage                     PT Education - 11/05/21 0905     Education Details Access Code: [email or web address] and toileting techniques    Person(s) Educated Patient    Methods Explanation;Demonstration;Tactile cues;Verbal cues;Handout    Comprehension Verbalized understanding;Returned demonstration              PT Short Term Goals - 11/05/21 0912       PT SHORT TERM GOAL #1   Title pt will be ind with initial HEP    Time 4    Period Weeks    Status New    Target Date 12/03/21               PT Long Term Goals - 11/05/21 0913       PT LONG TERM GOAL #1   Title Pt will be able to have intercourse without discomfort    Time 12    Period Weeks    Status New    Target Date 01/28/22      PT LONG TERM GOAL #2   Title Pt will be able to have a BM in 10 minutes or less when she has the urge at least 75% of the time    Baseline 20 minutes and not able to go most of the time    Time 12    Period Weeks    Status New    Target Date 01/28/22      PT LONG TERM GOAL #3   Title Pt will be ind with advanced HEP    Time 12    Period Weeks    Status New    Target Date 01/28/22      PT LONG TERM GOAL #4   Title Pt will have at least 75% less leakage of bowel or bladder due to less irritation in the abdomen from constipation    Time  12    Period Weeks    Status New    Target Date 01/28/22                    Plan - 11/05/21 0845     Clinical Impression Statement Pt presents to skilled PT due to constipation.  Pt has comorbidities as described below and all symtpoms are affecting her personal life and occupation. Pt lacks coordination of the pelvic being able to relax the pelvic floor after contacting and muscle spasm of the puborectalis in particular.  Pt has 2/5 MMT and holding for 4 seconds but gradually letting go.  Pt lacks sensation of exactly what the muscles are  doing.  Pt has tight hamstrings and gluteal.  Pt has decreased hip IR.  Pt has normal single leg standing and squat.  Unable to get any bulge in pelvic floor even with cueing.  Pt will benefit from skilled PT to address impairments so she can return to maximum functional toileting without leakage and overal healthy lifestyle.    Personal Factors and Comorbidities Comorbidity 1;Time since onset of injury/illness/exacerbation    Comorbidities crohn's;    Examination-Activity Limitations Toileting;Continence    Examination-Participation Restrictions Interpersonal Relationship;Community Activity;Occupation    Stability/Clinical Decision Making Evolving/Moderate complexity    Clinical Decision Making Moderate    Rehab Potential Excellent    PT Frequency 1x / week    PT Duration 12 weeks    PT Treatment/Interventions ADLs/Self Care Home Management;Biofeedback;Cryotherapy;Electrical Stimulation;Moist Heat;Neuromuscular re-education;Patient/family education;Therapeutic exercise;Therapeutic activities;Taping;Passive range of motion;Dry needling;Manual techniques    PT Next Visit Plan internal puborectalis release and possilby discuss needing rectal dilators, review stretches with breathing, discuss dry needling lumbar    Consulted and Agree with Plan of Care Patient             Patient will benefit from skilled therapeutic intervention in order to improve the following deficits and impairments:  Postural dysfunction, Decreased strength, Pain, Impaired flexibility, Increased muscle spasms, Decreased endurance, Decreased range of motion, Increased fascial restricitons, Impaired tone  Visit Diagnosis: Muscle weakness (generalized)  Unspecified lack of coordination  Cramp and spasm     Problem List Patient Active Problem List   Diagnosis Date Noted   Crohn's disease with complication ([place]) 25/03/3975   Vaccine counseling 01/11/2020   High risk medication use 01/11/2020   Family history of  breast cancer 01/05/2020   Women's annual routine gynecological examination 01/05/2020   Crohn's disease involving terminal ileum ([place]) 05/25/2018   Other constipation 12/03/2017    [name], PT 11/05/2021, 9:20 AM  [facility] @ [place], [place], [address] Phone: [phone]   Fax:  [phone]  Name: Lacey Hess MRN: [id number] Date of Birth: 1981/02/06

## 2021-11-05 NOTE — Patient Instructions (Signed)
Access Code: [email or web address] URL: [email or web address] Date: 11/05/2021 Prepared by: Jari Favre  Exercises Supine Hamstring Stretch with Doorway - 2 x daily - 7 x weekly - 1 sets - 3 reps - 20 sec hold Supine Hip Internal and External Rotation - 1 x daily - 7 x weekly - 1 sets - 10 reps - 5 sec hold

## 2021-11-13 ENCOUNTER — Other Ambulatory Visit: Payer: Self-pay

## 2021-11-13 ENCOUNTER — Ambulatory Visit: Payer: BC Managed Care – PPO | Admitting: Physical Therapy

## 2021-11-13 ENCOUNTER — Encounter: Payer: Self-pay | Admitting: Physical Therapy

## 2021-11-13 DIAGNOSIS — M6281 Muscle weakness (generalized): Secondary | ICD-10-CM

## 2021-11-13 DIAGNOSIS — K5902 Outlet dysfunction constipation: Secondary | ICD-10-CM | POA: Diagnosis not present

## 2021-11-13 DIAGNOSIS — R279 Unspecified lack of coordination: Secondary | ICD-10-CM

## 2021-11-13 DIAGNOSIS — R252 Cramp and spasm: Secondary | ICD-10-CM

## 2021-11-13 NOTE — Therapy (Signed)
[facility] @ [place], [place], [address] Phone: [phone]   Fax:  [phone]  Physical Therapy Treatment  Patient Details  Name: Lacey Hess MRN: [id number] Date of Birth: 1981/01/06 Referring Provider (PT): Mauri Pole   Encounter Date: 11/13/2021   PT End of Session - 11/13/21 1509     Visit Number 2    Date for PT Re-Evaluation 01/28/22    Authorization Type BCBS    PT Start Time 1401    PT Stop Time 9628    PT Time Calculation (min) 44 min    Activity Tolerance Patient tolerated treatment well    Behavior During Therapy [facility] for tasks assessed/performed             Past Medical History:  Diagnosis Date   B12 deficiency 2019   Crohn's disease ([place]) 12/2017    Past Surgical History:  Procedure Laterality Date   BREAST LUMPECTOMY Left    age 41yo   COLONOSCOPY  12/2017   mult ulcers in terminal ileum, othewise normal colon.  Dr. Harl Bowie    There were no vitals filed for this visit.   Subjective Assessment - 11/13/21 1452     Subjective Pt has been doing the stretches.  Still about the same    Patient Stated Goals be able to go more easily    Currently in Pain? No/denies                               [facility] Adult PT Treatment/Exercise - 11/13/21 0001       Neuro Re-ed    Neuro Re-ed Details  bulging pelvic floor      Exercises   Exercises Lumbar      Lumbar Exercises: Stretches   Active Hamstring Stretch 30 seconds    Lower Trunk Rotation Limitations hip rotation sitting and supine    Other Lumbar Stretch Exercise deep squat bulging    Other Lumbar Stretch Exercise sitting on ball bulging pelvic floor and on foam noodle relax and bulge      Manual Therapy   Manual Therapy Soft tissue mobilization    Soft tissue mobilization lumbar and gluteal STM; adductors bil Rt>Lt              Trigger Point Dry Needling - 11/13/21 0001      Consent Given? Yes    Education Handout Provided Yes    Muscles Treated Back/Hip Lumbar multifidi    Lumbar multifidi Response Twitch response elicited;Palpable increased muscle length                   PT Education - 11/13/21 1503     Education Details Access Code: [email or web address]    Person(s) Educated Patient    Methods Explanation;Demonstration;Tactile cues;Verbal cues;Handout    Comprehension Verbalized understanding;Returned demonstration              PT Short Term Goals - 11/05/21 0912       PT SHORT TERM GOAL #1   Title pt will be ind with initial HEP    Time 4    Period Weeks    Status New    Target Date 12/03/21               PT Long Term Goals - 11/05/21 0913       PT LONG TERM GOAL #1   Title Pt will be  able to have intercourse without discomfort    Time 12    Period Weeks    Status New    Target Date 01/28/22      PT LONG TERM GOAL #2   Title Pt will be able to have a BM in 10 minutes or less when she has the urge at least 75% of the time    Baseline 20 minutes and not able to go most of the time    Time 12    Period Weeks    Status New    Target Date 01/28/22      PT LONG TERM GOAL #3   Title Pt will be ind with advanced HEP    Time 12    Period Weeks    Status New    Target Date 01/28/22      PT LONG TERM GOAL #4   Title Pt will have at least 75% less leakage of bowel or bladder due to less irritation in the abdomen from constipation    Time 12    Period Weeks    Status New    Target Date 01/28/22                   Plan - 11/13/21 1453     Clinical Impression Statement Pt did well with treatment andable to bulge pelvic floor.  She felt this more with tactile cues.  Pt educated on breathing and bulging.  Will see at follow up how the response to dry needling was but she did report feelinga little more relaxed after treatment.  Pt will benefit from skilled PT to continue to work on functional goals.    Personal Factors  and Comorbidities Comorbidity 1;Time since onset of injury/illness/exacerbation    Examination-Activity Limitations Toileting;Continence    PT Treatment/Interventions ADLs/Self Care Home Management;Biofeedback;Cryotherapy;Electrical Stimulation;Moist Heat;Neuromuscular re-education;Patient/family education;Therapeutic exercise;Therapeutic activities;Taping;Passive range of motion;Dry needling;Manual techniques    PT Next Visit Plan f/u on stretches and how she did with DN; internal STM and discuss dilators if that seems appropriate, maybe downtraining with biofeedback    PT Home Exercise Plan Access Code: [email or web address]    Consulted and Agree with Plan of Care Patient             Patient will benefit from skilled therapeutic intervention in order to improve the following deficits and impairments:  Postural dysfunction, Decreased strength, Pain, Impaired flexibility, Increased muscle spasms, Decreased endurance, Decreased range of motion, Increased fascial restricitons, Impaired tone  Visit Diagnosis: Muscle weakness (generalized)  Unspecified lack of coordination  Cramp and spasm     Problem List Patient Active Problem List   Diagnosis Date Noted   Crohn's disease with complication ([place]) 80/99/8338   Vaccine counseling 01/11/2020   High risk medication use 01/11/2020   Family history of breast cancer 01/05/2020   Women's annual routine gynecological examination 01/05/2020   Crohn's disease involving terminal ileum ([place]) 05/25/2018   Other constipation 12/03/2017    [name], PT 11/13/2021, 3:09 PM  [facility] @ [place], [place], [address] Phone: [phone]   Fax:  [phone]  Name: Mahari Vankirk MRN: [id number] Date of Birth: Apr 23, 1981

## 2021-11-13 NOTE — Patient Instructions (Signed)
Access Code: [email or web address] URL: [email or web address] Date: 11/13/2021 Prepared by: Jari Favre  Exercises Supine Hamstring Stretch with Doorway - 2 x daily - 7 x weekly - 1 sets - 3 reps - 20 sec hold Supine Hip Internal and External Rotation - 1 x daily - 7 x weekly - 1 sets - 10 reps - 5 sec hold Standing Hamstring Stretch with Step - 1 x daily - 7 x weekly - 3 reps - 1 sets - 30 sec hold Supine Butterfly Groin Stretch - 1 x daily - 7 x weekly - 3 reps - 1 sets - 30 sec hold Supine Diaphragmatic Breathing with Pelvic Floor Lengthening - 3 x daily - 7 x weekly - 10 reps - 1 sets  Patient Education Trigger Point Dry Needling

## 2021-11-19 ENCOUNTER — Encounter: Payer: Self-pay | Admitting: Gastroenterology

## 2021-11-21 ENCOUNTER — Other Ambulatory Visit: Payer: Self-pay

## 2021-11-21 DIAGNOSIS — E538 Deficiency of other specified B group vitamins: Secondary | ICD-10-CM

## 2021-11-21 DIAGNOSIS — Z79899 Other long term (current) drug therapy: Secondary | ICD-10-CM

## 2021-11-21 DIAGNOSIS — K50018 Crohn's disease of small intestine with other complication: Secondary | ICD-10-CM

## 2021-11-25 ENCOUNTER — Other Ambulatory Visit: Payer: Self-pay

## 2021-11-25 DIAGNOSIS — K50018 Crohn's disease of small intestine with other complication: Secondary | ICD-10-CM

## 2021-11-27 ENCOUNTER — Other Ambulatory Visit (INDEPENDENT_AMBULATORY_CARE_PROVIDER_SITE_OTHER): Payer: BC Managed Care – PPO

## 2021-11-27 DIAGNOSIS — E538 Deficiency of other specified B group vitamins: Secondary | ICD-10-CM

## 2021-11-27 DIAGNOSIS — K50018 Crohn's disease of small intestine with other complication: Secondary | ICD-10-CM | POA: Diagnosis not present

## 2021-11-27 DIAGNOSIS — Z79899 Other long term (current) drug therapy: Secondary | ICD-10-CM

## 2021-11-27 LAB — HIGH SENSITIVITY CRP: CRP, High Sensitivity: 1.72 mg/L (ref 0.000–5.000)

## 2021-11-27 LAB — SEDIMENTATION RATE: Sed Rate: 10 mm/hr (ref 0–20)

## 2021-11-27 LAB — VITAMIN D 25 HYDROXY (VIT D DEFICIENCY, FRACTURES): VITD: 45.38 ng/mL (ref 30.00–100.00)

## 2021-12-02 LAB — VITAMIN C: Vitamin C: 1.3 mg/dL (ref 0.4–2.0)

## 2021-12-02 LAB — SELENIUM SERUM: Selenium, S/P: 152 ug/L (ref 93–198)

## 2021-12-02 LAB — VITAMIN B1: Thiamine: 132.7 nmol/L (ref 66.5–200.0)

## 2021-12-02 LAB — VITAMIN B6: Vitamin B6: 16.7 ug/L (ref 3.4–65.2)

## 2021-12-02 LAB — ZINC: Zinc: 71 ug/dL (ref 44–115)

## 2021-12-04 ENCOUNTER — Ambulatory Visit: Payer: BC Managed Care – PPO | Attending: Gastroenterology | Admitting: Physical Therapy

## 2021-12-04 ENCOUNTER — Other Ambulatory Visit: Payer: Self-pay

## 2021-12-04 DIAGNOSIS — M6281 Muscle weakness (generalized): Secondary | ICD-10-CM | POA: Diagnosis not present

## 2021-12-04 DIAGNOSIS — R279 Unspecified lack of coordination: Secondary | ICD-10-CM | POA: Diagnosis present

## 2021-12-04 DIAGNOSIS — R252 Cramp and spasm: Secondary | ICD-10-CM | POA: Insufficient documentation

## 2021-12-04 NOTE — Therapy (Signed)
[facility] @ [place], [place], [address] Phone: [phone]   Fax:  [phone]  Physical Therapy Treatment  Patient Details  Name: Lacey Hess MRN: [id number] Date of Birth: 29-Sep-1981 Referring Provider (PT): Mauri Pole   Encounter Date: 12/04/2021   PT End of Session - 12/04/21 1107     Visit Number 3    Date for PT Re-Evaluation 01/28/22    Authorization Type BCBS    PT Start Time 0931    PT Stop Time 1012    PT Time Calculation (min) 41 min    Activity Tolerance Patient tolerated treatment well    Behavior During Therapy [facility] for tasks assessed/performed             Past Medical History:  Diagnosis Date   B12 deficiency 2019   Crohn's disease ([place]) 12/2017    Past Surgical History:  Procedure Laterality Date   BREAST LUMPECTOMY Left    age 41yo   COLONOSCOPY  12/2017   mult ulcers in terminal ileum, othewise normal colon.  Dr. Harl Bowie    There were no vitals filed for this visit.   Subjective Assessment - 12/04/21 0938     Subjective The back felt looser.  BMs are still every other day and incomplete.  I did a colonoscopy prep and still recovering from that    Patient Stated Goals be able to go more easily    Currently in Pain? No/denies                               [facility] Adult PT Treatment/Exercise - 12/04/21 0001       Self-Care   Other Self-Care Comments  educated and info given on dilators      Manual Therapy   Soft tissue mobilization lumbar and thoracic paraspinals              Trigger Point Dry Needling - 12/04/21 0001     Consent Given? Yes    Education Handout Provided Previously provided    Lumbar multifidi Response Twitch response elicited;Palpable increased muscle length                   PT Education - 12/04/21 1057     Education Details dioator info    Person(s) Educated Patient    Methods  Explanation;Demonstration;Tactile cues;Verbal cues;Handout    Comprehension Verbalized understanding;Returned demonstration              PT Short Term Goals - 11/05/21 0912       PT SHORT TERM GOAL #1   Title pt will be ind with initial HEP    Time 4    Period Weeks    Status New    Target Date 12/03/21               PT Long Term Goals - 12/04/21 1058       PT LONG TERM GOAL #1   Title Pt will be able to have intercourse without discomfort    Status On-going      PT LONG TERM GOAL #2   Title Pt will be able to have a BM in 10 minutes or less when she has the urge at least 75% of the time    Baseline still working on this as she is working with dietician also andjust did cleansing    Status On-going  PT LONG TERM GOAL #3   Title Pt will be ind with advanced HEP    Status On-going                   Plan - 12/04/21 1059     Clinical Impression Statement Pt was educated in dilators. She also tolerated internal STM well to puborectalis which was very tight and released during treatemtn.  Pt still having tension in lumbar multifidi but responded well to dry needling and released.  Difficulty to tell what has worked yet due to she was sick and also had to do a colon cleanse per her dieticien.  Pt will benefit from skilled PT to continue working on muscle tone and coordination for improved BMs.    PT Treatment/Interventions ADLs/Self Care Home Management;Biofeedback;Cryotherapy;Electrical Stimulation;Moist Heat;Neuromuscular re-education;Patient/family education;Therapeutic exercise;Therapeutic activities;Taping;Passive range of motion;Dry needling;Manual techniques    PT Next Visit Plan f/u on dilators, thoracic and lumbar, gluteal STM and DN #3 if helping    PT Home Exercise Plan Access Code: [email or web address]    Consulted and Agree with Plan of Care Patient             Patient will benefit from skilled therapeutic intervention in order to improve the following  deficits and impairments:  Postural dysfunction, Decreased strength, Pain, Impaired flexibility, Increased muscle spasms, Decreased endurance, Decreased range of motion, Increased fascial restricitons, Impaired tone  Visit Diagnosis: Muscle weakness (generalized)  Unspecified lack of coordination  Cramp and spasm     Problem List Patient Active Problem List   Diagnosis Date Noted   Crohn's disease with complication ([place]) 59/74/1638   Vaccine counseling 01/11/2020   High risk medication use 01/11/2020   Family history of breast cancer 01/05/2020   Women's annual routine gynecological examination 01/05/2020   Crohn's disease involving terminal ileum ([place]) 05/25/2018   Other constipation 12/03/2017    [name], PT 12/04/2021, 11:10 AM  [facility] @ [place], [place], [address] Phone: [phone]   Fax:  [phone]  Name: Keilin Gamboa MRN: [id number] Date of Birth: 03-18-1981

## 2021-12-04 NOTE — Patient Instructions (Signed)
Guide to Using a Anal Dilator ( No radiation) The anal dilator is used to stretch the anal canal from surgery or radiation. Is  a smooth plastic cylinder that is 6 inches long. It is used to stretch the anal canal to assist in having a bowel movement.   Use the dilator your therapist has directed you to  Lubricate both the anus and tip of the dilator.  Do not use petroleum based lubricant due to increased risk of infection and more difficult to wash off.  Lay on your side to insert the tip of the dilator at a right angle to the rectum and lightly insert the dilator.  Exhale as you gently ease the dilator into the anal canal.  Breathe in deeply and inch the dilator deeper. See below for frequency: Anal stenosis- holds dilator in 2-3 minutes 2 times daily for 3-4 months Anal fissure- hold dilator 2-3 minutes 1x/day 7-15 days Hemmorrhoidectomy- use 1 time per day for 1st month then every other day for 2nd month, and 2x/wk. for third month Remove the dilator. Wash your hands and the dilator with warm water and soap. Let the dilator dry completely to prevent bacteria build-up.

## 2021-12-09 ENCOUNTER — Telehealth: Payer: Self-pay | Admitting: Pharmacy Technician

## 2021-12-09 ENCOUNTER — Other Ambulatory Visit: Payer: Self-pay

## 2021-12-09 ENCOUNTER — Ambulatory Visit: Payer: BC Managed Care – PPO | Admitting: Gastroenterology

## 2021-12-09 ENCOUNTER — Encounter: Payer: Self-pay | Admitting: Gastroenterology

## 2021-12-09 VITALS — BP 100/60 | HR 64 | Ht 65.0 in | Wt 170.0 lb

## 2021-12-09 DIAGNOSIS — E538 Deficiency of other specified B group vitamins: Secondary | ICD-10-CM | POA: Diagnosis not present

## 2021-12-09 DIAGNOSIS — K5900 Constipation, unspecified: Secondary | ICD-10-CM

## 2021-12-09 DIAGNOSIS — K5090 Crohn's disease, unspecified, without complications: Secondary | ICD-10-CM | POA: Diagnosis not present

## 2021-12-09 DIAGNOSIS — R1110 Vomiting, unspecified: Secondary | ICD-10-CM

## 2021-12-09 DIAGNOSIS — D508 Other iron deficiency anemias: Secondary | ICD-10-CM | POA: Diagnosis not present

## 2021-12-09 DIAGNOSIS — R110 Nausea: Secondary | ICD-10-CM

## 2021-12-09 DIAGNOSIS — K529 Noninfective gastroenteritis and colitis, unspecified: Secondary | ICD-10-CM

## 2021-12-09 MED ORDER — CYANOCOBALAMIN 1000 MCG/ML IJ SOLN: INTRAMUSCULAR | 11 refills | Status: DC

## 2021-12-09 NOTE — Telephone Encounter (Signed)
Dr. Silverio Decamp,  Auth Submission: denied Payer: BCBS Medication & CPT/J Code(s) submitted: Feraheme (ferumoxytol) [phone] Route of submission (phone, fax, portal): PHONE Auth type: Buy/Bill Units/visits requested: 1 Reference number: [id number] 3:27P - Denise-M  Denied due to patient has not tried and or failed step therapy. Would you like to to try venofer?  Please re-enter therapy plan if agreeable and we will schedule patient as soon as possible.

## 2021-12-09 NOTE — Progress Notes (Signed)
Lacey Hess    [id number]    1981/05/28  Primary Care Physician:Tysinger, Camelia Eng, PA-C  Referring Physician: Carlena Hurl, PA-C [address] [place],  [place] [address]   Chief complaint: Crohn's disease  HPI:  41 year old very pleasant female with Crohn's disease here for follow-up visit  She continues to have irregular bowel habits, sensation of incomplete evacuation despite taking Linzess daily. She is having episodes of nausea and vomiting and also abdominal cramping intermittently   She is currently seeing a dietitian, is trying to eat better and identify any possible triggers but has not been successful so far.  She had a panel tested for any vitamin deficiency, was unremarkable   Mother diagnosed with Crohn's disease in 2021 and and has ileostomy with high output.  She is worried if she ever has to undergo a colostomy or an ileostomy   GI history: On Humira biweekly injection.    Humira antibody level detected at 28 and drug level 16 on September 06, 2018.    Colonoscopy December 06, 2017 showed multiple aphthous ulcers in terminal ileum, biopsy consistent with erosions and active inflammation. CT enterography April 2019 with contrast showed mild wall thickening and abnormal mucosal enhancement in terminal ileum highly suspicious for Crohn's disease.     Outpatient Encounter Medications as of 12/09/2021  Medication Sig   acetaminophen (TYLENOL) 500 MG tablet Take 500-1,000 mg by mouth every 8 (eight) hours as needed (for pain).    AMBULATORY NON FORMULARY MEDICATION Medication Name: nitroglycerin 0.125% gel. Apply a pea size amount to your rectum three times daily x 6-8 weeks.   cyanocobalamin (,VITAMIN B-12,) 1000 MCG/ML injection ADMINISTER 1 ML(1000 MCG) IN THE MUSCLE EVERY 30 DAYS   etonogestrel-ethinyl estradiol (NUVARING) 0.12-0.015 MG/24HR vaginal ring Place 1 each vaginally every 28 (twenty-eight) days. Insert vaginally and leave in place for 3  consecutive weeks, then remove for 1 week.   HUMIRA PEN 40 MG/0.4ML PNKT INJECT 1 PEN UNDER THE SKIN EVERY 14 DAYS.   linaclotide (LINZESS) 145 MCG CAPS capsule Take 1 capsule (145 mcg total) by mouth daily before breakfast.   ondansetron (ZOFRAN ODT) 4 MG disintegrating tablet Take 1 tablet (4 mg total) by mouth every 8 (eight) hours as needed for nausea or vomiting. (Patient not taking: Reported on 10/06/2021)   TUBERCULIN SYR 1CC/26GX3/8" (B-D TB SYRINGE 1CC/26GX3/8") 26G X 3/8" 1 ML MISC Use every 3 weeks to administer Vitamin B12 injection IM   No facility-administered encounter medications on file as of 12/09/2021.    Allergies as of 12/09/2021 - Review Complete 10/19/2021  Allergen Reaction Noted   Adhesive [tape] Rash 08/29/2018    Past Medical History:  Diagnosis Date   B12 deficiency 2019   Crohn's disease ([place]) 12/2017    Past Surgical History:  Procedure Laterality Date   BREAST LUMPECTOMY Left    age 34yo   COLONOSCOPY  12/2017   mult ulcers in terminal ileum, othewise normal colon.  Dr. Harl Bowie    Family History  Problem Relation Age of Onset   Diabetes Maternal Aunt    Crohn's disease Mother    Hepatitis Father    Cancer Maternal Grandmother        lung   Drug abuse Brother    Heart disease Paternal Aunt        CHF   Cancer Paternal Grandmother        breast   Heart disease Paternal Grandmother  CHF   Colon cancer Neg Hx    Esophageal cancer Neg Hx    Rectal cancer Neg Hx     Social History   Socioeconomic History   Marital status: Married    Spouse name: Not on file   Number of children: 0   Years of education: Not on file   Highest education level: Not on file  Occupational History   Occupation: [employer]  Tobacco Use   Smoking status: Never   Smokeless tobacco: Never  Vaping Use   Vaping Use: Never used  Substance and Sexual Activity   Alcohol use: Yes    Comment: 2-3 glasses weekly    Drug use: No   Sexual activity: Not on  file  Other Topics Concern   Not on file  Social History Narrative   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  Exercise - peloton, yoga, weights.  01/2020.     Social Determinants of Health   Financial Resource Strain: Not on file  Food Insecurity: No Food Insecurity   Worried About Charity fundraiser in the Last Year: Never true   Ran Out of Food in the Last Year: Never true  Transportation Needs: No Transportation Needs   Lack of Transportation (Medical): No   Lack of Transportation (Non-Medical): No  Physical Activity: Not on file  Stress: Not on file  Social Connections: Not on file  Intimate Partner Violence: Not on file      Review of systems: All other review of systems negative except as mentioned in the HPI.   Physical Exam: Vitals:   12/09/21 0837  BP: 100/60  Pulse: 64   Body mass index is 28.29 kg/m. Gen:      No acute distress HEENT:  sclera anicteric Abd:      soft, non-tender; no palpable masses, no distension Ext:    No edema Neuro: alert and oriented x 3 Psych: normal mood and affect  Data Reviewed:  Reviewed labs, radiology imaging, old records and pertinent past GI work up   Assessment and Plan/Recommendations:  41 year old very pleasant female with history of Crohn's disease, predominantly involving terminal ileum Irregular bowel habits with alternating constipation and diarrhea with episodes of fecal incontinence Sensation of incomplete evacuation Recent onset episodes of nausea, vomiting and abdominal pain We will need to exclude Crohn's flare or upper GI involvement of Crohn's Schedule for EGD and colonoscopy for further evaluation  Obtain right upper quadrant ultrasound to exclude gallbladder disease  B12 deficiency: Increase B12 injection frequency to weekly for 4 weeks and then continue maintenance injection every 2 weeks  Borderline iron deficiency: She is not tolerating oral iron due to  worsening constipation and abdominal discomfort.  We will request insurance approval for IV iron infusion   Humira drug level is within therapeutic range in January 2022 with undetectable antibodies, continue current regimen  Return after EGD and colonoscopy  This visit required 40 minutes of patient care (this includes precharting, chart review, review of results, face-to-face time used for counseling as well as treatment plan and follow-up. The patient was provided an opportunity to ask questions and all were answered. The patient agreed with the plan and demonstrated an understanding of the instructions.  Damaris Hippo , MD    CC: Tysinger, Camelia Eng, PA-C

## 2021-12-09 NOTE — Telephone Encounter (Signed)
Ok, thanks.

## 2021-12-09 NOTE — Telephone Encounter (Signed)
Thanks Joelene Millin for checking.  Will have patient stay on oral iron replacement and if she has persistent iron deficiency will reconsider IV iron infusion.

## 2021-12-09 NOTE — Patient Instructions (Addendum)
You have been scheduled for an endoscopy and colonoscopy. Please follow the written instructions given to you at your visit today. Please pick up your prep supplies at the pharmacy within the next 1-3 days. If you use inhalers (even only as needed), please bring them with you on the day of your procedure.   You have been scheduled for an abdominal ultrasound at [facility] (1st floor of hospital) on _________ at ___________. Please arrive 15 minutes prior to your appointment for registration. Make certain not to have anything to eat or drink 6 hours prior to your appointment. Should you need to reschedule your appointment, please contact radiology at [phone]. This test typically takes about 30 minutes to perform.   Start Weekly B12 injections x 4 weeks  then every 2 weeks  You will be contacted by [facility] in the next 2 days to arrange a Ultrasound   The number on your caller ID will be [phone], please answer when they call.  If you have not heard from them in 2 days please call [phone] to schedule.      We will refer you for IVI ron infusion , Contact Beth Dr Jillyn Hidden nurse with any questions about iron infusion   Due to recent changes in healthcare laws, you may see the results of your imaging and laboratory studies on MyChart before your provider has had a chance to review them.  We understand that in some cases there may be results that are confusing or concerning to you. Not all laboratory results come back in the same time frame and the provider may be waiting for multiple results in order to interpret others.  Please give [place] 48 hours in order for your provider to thoroughly review all the results before contacting the office for clarification of your results.    I appreciate the  opportunity to care for you  Thank You   Harl Bowie , MD

## 2021-12-11 ENCOUNTER — Encounter: Payer: Self-pay | Admitting: Gastroenterology

## 2021-12-11 ENCOUNTER — Other Ambulatory Visit: Payer: Self-pay

## 2021-12-11 ENCOUNTER — Ambulatory Visit: Payer: BC Managed Care – PPO | Admitting: Physical Therapy

## 2021-12-11 ENCOUNTER — Encounter: Payer: Self-pay | Admitting: Physical Therapy

## 2021-12-11 DIAGNOSIS — R252 Cramp and spasm: Secondary | ICD-10-CM

## 2021-12-11 DIAGNOSIS — R279 Unspecified lack of coordination: Secondary | ICD-10-CM

## 2021-12-11 DIAGNOSIS — M6281 Muscle weakness (generalized): Secondary | ICD-10-CM

## 2021-12-11 NOTE — Therapy (Signed)
[facility] @ [place], [place], [address] Phone: [phone]   Fax:  [phone]  Physical Therapy Treatment  Patient Details  Name: Lacey Hess MRN: [id number] Date of Birth: 10-23-1981 Referring Provider (PT): Mauri Pole   Encounter Date: 12/11/2021   PT End of Session - 12/11/21 0851     Visit Number 4    Date for PT Re-Evaluation 01/28/22    Authorization Type BCBS    PT Start Time 0846    PT Stop Time 0931    PT Time Calculation (min) 45 min    Activity Tolerance Patient tolerated treatment well    Behavior During Therapy [facility] for tasks assessed/performed             Past Medical History:  Diagnosis Date   B12 deficiency 2019   Crohn's disease ([place]) 12/2017    Past Surgical History:  Procedure Laterality Date   BREAST LUMPECTOMY Left    age 41yo   COLONOSCOPY  12/2017   mult ulcers in terminal ileum, othewise normal colon.  Dr. Harl Bowie    There were no vitals filed for this visit.   Subjective Assessment - 12/11/21 0932     Subjective Pt states things are about the same, have been using the dilator but still not getting it all the way in.    Patient Stated Goals be able to go more easily    Currently in Pain? No/denies                               [facility] Adult PT Treatment/Exercise - 12/11/21 0001       Self-Care   Other Self-Care Comments  dilator info      Manual Therapy   Manual Therapy Myofascial release;Internal Pelvic Floor    Manual therapy comments pt identity confimred and informed consent given    Soft tissue mobilization lumbar and thoracic paraspinals    Myofascial Release abdominal release around colon, coccyx release sitting and in qped positions    Internal Pelvic Floor sphincters and just to puborectalis, unable to get all the way around puborectalis today              Trigger Point Dry Needling - 12/11/21 0001      Consent Given? Yes    Education Handout Provided Previously provided    Lumbar multifidi Response Twitch response elicited;Palpable increased muscle length                     PT Short Term Goals - 11/05/21 0912       PT SHORT TERM GOAL #1   Title pt will be ind with initial HEP    Time 4    Period Weeks    Status New    Target Date 12/03/21               PT Long Term Goals - 12/04/21 1058       PT LONG TERM GOAL #1   Title Pt will be able to have intercourse without discomfort    Status On-going      PT LONG TERM GOAL #2   Title Pt will be able to have a BM in 10 minutes or less when she has the urge at least 75% of the time    Baseline still working on this as she is working with dietician also andjust did  cleansing    Status On-going      PT LONG TERM GOAL #3   Title Pt will be ind with advanced HEP    Status On-going                   Plan - 12/11/21 0933     Clinical Impression Statement Pt has been using the dilators.  Review of how to use and how to insert was gone over during [facility] treatment  Pt still having a lot of tension in the pelvic floor and especially puborectalis.  Unsure but it felt like there was stool in the rectum.  Treament also did abominal fascial release today in order to work on imrpoved movement in colon.  Pt will benefit from skilled PT to continue to address pelvic floor tension that is stopping the emptying of solid stool.    PT Treatment/Interventions ADLs/Self Care Home Management;Biofeedback;Cryotherapy;Electrical Stimulation;Moist Heat;Neuromuscular re-education;Patient/family education;Therapeutic exercise;Therapeutic activities;Taping;Passive range of motion;Dry needling;Manual techniques    PT Next Visit Plan f/u on dilators, thoracic and lumbar, gluteal STM and DN #3 if helping    PT Home Exercise Plan Access Code: [email or web address]    Consulted and Agree with Plan of Care Patient             Patient will benefit  from skilled therapeutic intervention in order to improve the following deficits and impairments:  Postural dysfunction, Decreased strength, Pain, Impaired flexibility, Increased muscle spasms, Decreased endurance, Decreased range of motion, Increased fascial restricitons, Impaired tone  Visit Diagnosis: Muscle weakness (generalized)  Unspecified lack of coordination  Cramp and spasm     Problem List Patient Active Problem List   Diagnosis Date Noted   Crohn's disease with complication ([place]) 09/62/8366   Vaccine counseling 01/11/2020   High risk medication use 01/11/2020   Family history of breast cancer 01/05/2020   Women's annual routine gynecological examination 01/05/2020   Crohn's disease involving terminal ileum ([place]) 05/25/2018   Other constipation 12/03/2017    Lacey Hess, PT 12/11/2021, 10:02 AM  [facility] @ [place], [place], [address] Phone: [phone]   Fax:  [phone]  Name: Lacey Hess MRN: [id number] Date of Birth: July 04, 1981

## 2021-12-16 ENCOUNTER — Ambulatory Visit (HOSPITAL_COMMUNITY)
Admission: RE | Admit: 2021-12-16 | Discharge: 2021-12-16 | Disposition: A | Discharge status: Home / Self Care | Payer: BC Managed Care – PPO | Source: Ambulatory Visit | Attending: Gastroenterology | Admitting: Gastroenterology

## 2021-12-16 ENCOUNTER — Other Ambulatory Visit: Payer: Self-pay

## 2021-12-16 DIAGNOSIS — R1110 Vomiting, unspecified: Secondary | ICD-10-CM | POA: Insufficient documentation

## 2021-12-16 DIAGNOSIS — R110 Nausea: Secondary | ICD-10-CM | POA: Insufficient documentation

## 2021-12-16 DIAGNOSIS — E538 Deficiency of other specified B group vitamins: Secondary | ICD-10-CM | POA: Diagnosis present

## 2021-12-16 DIAGNOSIS — K5090 Crohn's disease, unspecified, without complications: Secondary | ICD-10-CM | POA: Insufficient documentation

## 2021-12-16 DIAGNOSIS — K529 Noninfective gastroenteritis and colitis, unspecified: Secondary | ICD-10-CM | POA: Insufficient documentation

## 2021-12-16 DIAGNOSIS — K5900 Constipation, unspecified: Secondary | ICD-10-CM | POA: Insufficient documentation

## 2021-12-16 DIAGNOSIS — D508 Other iron deficiency anemias: Secondary | ICD-10-CM | POA: Diagnosis present

## 2021-12-18 ENCOUNTER — Encounter: Payer: Self-pay | Admitting: Physical Therapy

## 2021-12-18 ENCOUNTER — Ambulatory Visit: Payer: BC Managed Care – PPO | Admitting: Physical Therapy

## 2021-12-18 ENCOUNTER — Other Ambulatory Visit: Payer: Self-pay

## 2021-12-18 DIAGNOSIS — M6281 Muscle weakness (generalized): Secondary | ICD-10-CM | POA: Diagnosis not present

## 2021-12-18 DIAGNOSIS — R279 Unspecified lack of coordination: Secondary | ICD-10-CM

## 2021-12-18 DIAGNOSIS — R252 Cramp and spasm: Secondary | ICD-10-CM

## 2021-12-18 NOTE — Therapy (Signed)
[facility] @ [place], [place], [address] Phone: [phone]   Fax:  [phone]  Physical Therapy Treatment  Patient Details  Name: Lacey Hess MRN: [id number] Date of Birth: [birth year] Referring Provider (PT): Mauri Pole   Encounter Date: 12/18/2021   PT End of Session - 12/18/21 1015     Visit Number 5    Date for PT Re-Evaluation 01/28/22    Authorization Type BCBS    PT Start Time 0932    PT Stop Time 1010    PT Time Calculation (min) 38 min    Activity Tolerance Patient tolerated treatment well    Behavior During Therapy [facility] for tasks assessed/performed             Past Medical History:  Diagnosis Date   B12 deficiency 2019   Crohn's disease ([place]) 12/2017    Past Surgical History:  Procedure Laterality Date   BREAST LUMPECTOMY Left    age 41yo   COLONOSCOPY  12/2017   mult ulcers in terminal ileum, othewise normal colon.  Dr. Harl Bowie    There were no vitals filed for this visit.   Subjective Assessment - 12/18/21 0939     Subjective Still doing about the same.  I was able to use the 2nd dilator    Patient Stated Goals be able to go more easily    Currently in Pain? No/denies                               [facility] Adult PT Treatment/Exercise - 12/18/21 0001       Lumbar Exercises: Stretches   Other Lumbar Stretch Exercise chair down dog and thoracic rotation supine - 2 min each      Manual Therapy   Soft tissue mobilization quad and hamstrings manual, skilled palpation with DN and addaday techniques              Trigger Point Dry Needling - 12/18/21 0001     Consent Given? Yes    Education Handout Provided Previously provided    Muscles Treated Lower Quadrant Rectus femoris;Quadriceps;Hamstring    Rectus femoris Response Twitch response elicited;Palpable increased muscle length    Hamstring Response Twitch response elicited;Palpable  increased muscle length    Lumbar multifidi Response Twitch response elicited;Palpable increased muscle length                     PT Short Term Goals - 11/05/21 0912       PT SHORT TERM GOAL #1   Title pt will be ind with initial HEP    Time 4    Period Weeks    Status New    Target Date 12/03/21               PT Long Term Goals - 12/04/21 1058       PT LONG TERM GOAL #1   Title Pt will be able to have intercourse without discomfort    Status On-going      PT LONG TERM GOAL #2   Title Pt will be able to have a BM in 10 minutes or less when she has the urge at least 75% of the time    Baseline still working on this as she is working with dietician also andjust did cleansing    Status On-going      PT LONG TERM GOAL #  3   Title Pt will be ind with advanced HEP    Status On-going                   Plan - 12/18/21 1010     Clinical Impression Statement Today's session focused on increased hamstring length and being able to separate hip from lumbar with movements.  Dilator use was reviewed and techniques to work on relaxing when using the dilators.  Pt was given stretch to continue with improved mobility and improve soft tissue length.    PT Treatment/Interventions ADLs/Self Care Home Management;Biofeedback;Cryotherapy;Electrical Stimulation;Moist Heat;Neuromuscular re-education;Patient/family education;Therapeutic exercise;Therapeutic activities;Taping;Passive range of motion;Dry needling;Manual techniques    PT Next Visit Plan f/u on DN and STM to hamstrings and quads #2 if helped, thoracic rotation stretches and hip rotation together    PT Home Exercise Plan Access Code: [email or web address]    Consulted and Agree with Plan of Care Patient             Patient will benefit from skilled therapeutic intervention in order to improve the following deficits and impairments:  Postural dysfunction, Decreased strength, Pain, Impaired flexibility, Increased muscle  spasms, Decreased endurance, Decreased range of motion, Increased fascial restricitons, Impaired tone  Visit Diagnosis: Muscle weakness (generalized)  Unspecified lack of coordination  Cramp and spasm     Problem List Patient Active Problem List   Diagnosis Date Noted   Crohn's disease with complication ([place]) 54/07/8118   Vaccine counseling 01/11/2020   High risk medication use 01/11/2020   Family history of breast cancer 01/05/2020   Women's annual routine gynecological examination 01/05/2020   Crohn's disease involving terminal ileum ([place]) 05/25/2018   Other constipation 12/03/2017    [name], PT 12/18/2021, 10:17 AM  [facility] @ [place], [place], [address] Phone: [phone]   Fax:  [phone]  Name: Lacey Hess MRN: [id number] Date of Birth: [birth year]

## 2021-12-25 ENCOUNTER — Encounter: Payer: Self-pay | Admitting: Physical Therapy

## 2021-12-25 ENCOUNTER — Ambulatory Visit: Payer: BC Managed Care – PPO | Admitting: Physical Therapy

## 2021-12-25 ENCOUNTER — Other Ambulatory Visit: Payer: Self-pay

## 2021-12-25 DIAGNOSIS — R279 Unspecified lack of coordination: Secondary | ICD-10-CM

## 2021-12-25 DIAGNOSIS — R252 Cramp and spasm: Secondary | ICD-10-CM

## 2021-12-25 DIAGNOSIS — M6281 Muscle weakness (generalized): Secondary | ICD-10-CM

## 2021-12-25 NOTE — Therapy (Signed)
[facility] @ [place], [place], [address] Phone: [phone]   Fax:  [phone]  Physical Therapy Treatment  Patient Details  Name: Lacey Hess MRN: [id number] Date of Birth: 06-Jan-1981 Referring Provider (PT): Mauri Pole   Encounter Date: 12/25/2021   PT End of Session - 12/25/21 0849     Visit Number 6    Date for PT Re-Evaluation 01/28/22    Authorization Type BCBS    PT Start Time 0847    PT Stop Time 0925    PT Time Calculation (min) 38 min    Activity Tolerance Patient tolerated treatment well    Behavior During Therapy [facility] for tasks assessed/performed             Past Medical History:  Diagnosis Date   B12 deficiency 2019   Crohn's disease ([place]) 12/2017    Past Surgical History:  Procedure Laterality Date   BREAST LUMPECTOMY Left    age 87yo   COLONOSCOPY  12/2017   mult ulcers in terminal ileum, othewise normal colon.  Dr. Harl Bowie    There were no vitals filed for this visit.                      [facility] Adult PT Treatment/Exercise - 12/25/21 0001       Manual Therapy   Manual therapy comments pt identity confimred and informed consent given    Myofascial Release abdominal release supine to ascending colon and trasverse, liver and stomach mobility; seated ribcage rotation stretches    Internal Pelvic Floor sphincters and just to puborectalis, able to palpate and press on top of puborectalis today                       PT Short Term Goals - 11/05/21 0912       PT SHORT TERM GOAL #1   Title pt will be ind with initial HEP    Time 4    Period Weeks    Status New    Target Date 12/03/21               PT Long Term Goals - 12/04/21 1058       PT LONG TERM GOAL #1   Title Pt will be able to have intercourse without discomfort    Status On-going      PT LONG TERM GOAL #2   Title Pt will be able to have a BM in 10 minutes  or less when she has the urge at least 75% of the time    Baseline still working on this as she is working with dietician also andjust did cleansing    Status On-going      PT LONG TERM GOAL #3   Title Pt will be ind with advanced HEP    Status On-going                   Plan - 12/25/21 0923     Clinical Impression Statement Able to get onto the puborectalis today so muscles are relaxing a bit better. Overall, she is still feeling less abdominal discomfort, but going frequently with little BMs.  Pt will benefit from skilled PT to keep working on muscle length of pelvic floor.    PT Treatment/Interventions ADLs/Self Care Home Management;Biofeedback;Cryotherapy;Electrical Stimulation;Moist Heat;Neuromuscular re-education;Patient/family education;Therapeutic exercise;Therapeutic activities;Taping;Passive range of motion;Dry needling;Manual techniques    PT Next Visit Plan internal stretching  puborectalis and see if able to get past to levators; maybe release muscle vaginally if not able to rectally    PT Home Exercise Plan Access Code: [email or web address]    Consulted and Agree with Plan of Care Patient             Patient will benefit from skilled therapeutic intervention in order to improve the following deficits and impairments:  Postural dysfunction, Decreased strength, Pain, Impaired flexibility, Increased muscle spasms, Decreased endurance, Decreased range of motion, Increased fascial restricitons, Impaired tone  Visit Diagnosis: Muscle weakness (generalized)  Unspecified lack of coordination  Cramp and spasm     Problem List Patient Active Problem List   Diagnosis Date Noted   Crohn's disease with complication ([place]) 98/72/1587   Vaccine counseling 01/11/2020   High risk medication use 01/11/2020   Family history of breast cancer 01/05/2020   Women's annual routine gynecological examination 01/05/2020   Crohn's disease involving terminal ileum ([place]) 05/25/2018   Other  constipation 12/03/2017    [name], PT 12/25/2021, 9:29 AM  [facility] @ [place], [place], [address] Phone: [phone]   Fax:  [phone]  Name: Lacey Hess MRN: [id number] Date of Birth: [birth year]

## 2021-12-28 ENCOUNTER — Encounter: Payer: Self-pay | Admitting: Gastroenterology

## 2021-12-29 ENCOUNTER — Other Ambulatory Visit: Payer: Self-pay

## 2021-12-29 MED ORDER — CYANOCOBALAMIN 1000 MCG/ML IJ SOLN: INTRAMUSCULAR | 11 refills | Status: DC

## 2021-12-30 ENCOUNTER — Ambulatory Visit: Payer: BC Managed Care – PPO | Admitting: Gastroenterology

## 2021-12-30 ENCOUNTER — Encounter: Payer: Self-pay | Admitting: Gastroenterology

## 2021-12-30 VITALS — BP 92/54 | HR 67 | Ht 66.0 in | Wt 169.4 lb

## 2021-12-30 DIAGNOSIS — K5000 Crohn's disease of small intestine without complications: Secondary | ICD-10-CM

## 2021-12-30 DIAGNOSIS — K5902 Outlet dysfunction constipation: Secondary | ICD-10-CM

## 2021-12-30 DIAGNOSIS — R109 Unspecified abdominal pain: Secondary | ICD-10-CM | POA: Diagnosis not present

## 2021-12-30 DIAGNOSIS — M6289 Other specified disorders of muscle: Secondary | ICD-10-CM

## 2021-12-30 DIAGNOSIS — K5904 Chronic idiopathic constipation: Secondary | ICD-10-CM | POA: Diagnosis not present

## 2021-12-30 NOTE — Patient Instructions (Addendum)
We have cancelled your procedures as requested   Continue Linzess  Titrate down Miralax and use as needed after that  If you are age 39 or older, your body mass index should be between 23-30. Your Body mass index is 27.34 kg/m. If this is out of the aforementioned range listed, please consider follow up with your Primary Care Provider.  If you are age 64 or younger, your body mass index should be between 19-25. Your Body mass index is 27.34 kg/m. If this is out of the aformentioned range listed, please consider follow up with your Primary Care Provider.   ________________________________________________________  The [facility] providers would like to encourage you to use [facility] to communicate with providers for non-urgent requests or questions.  Due to long hold times on the telephone, sending your provider a message by [facility] may be a faster and more efficient way to get a response.  Please allow 48 business hours for a response.  Please remember that this is for non-urgent requests.  _______________________________________________________   I appreciate the  opportunity to care for you  Thank You   Harl Bowie , MD

## 2021-12-30 NOTE — Progress Notes (Signed)
Lacey Hess    [id number]    1981-07-22  Primary Care Physician:Tysinger, Camelia Eng, PA-C  Referring Physician: Carlena Hurl, PA-C [address] [place],  [place] [address]   Chief complaint:  Crohn's disease, constipation  HPI: 41 year old very pleasant female with Crohn's disease here for follow-up visit   Bowel habits are improving, she is able to have more regular bowel movements with combination of Linzess and 2 capfuls of MiraLAX daily She has not had any episodes of vomiting and is also noticing improvement of abdominal discomfort  Right upper quadrant ultrasound December 16, 2021 was negative for any acute pathology, no cholelithiasis or cholecystitis   She is currently seeing a dietitian, is trying to eat better and identify any possible triggers but has not been successful so far.  She had a panel tested for any vitamin deficiency, was unremarkable   Mother diagnosed with Crohn's disease in 2021 and and has ileostomy with high output.  She is worried if she ever has to undergo a colostomy or an ileostomy   GI history: On Humira biweekly injection.    Humira antibody level detected at 28 and drug level 16 on September 06, 2018.    Colonoscopy December 06, 2017 showed multiple aphthous ulcers in terminal ileum, biopsy consistent with erosions and active inflammation. CT enterography April 2019 with contrast showed mild wall thickening and abnormal mucosal enhancement in terminal ileum highly suspicious for Crohn's disease.   Outpatient Encounter Medications as of 12/30/2021  Medication Sig   acetaminophen (TYLENOL) 500 MG tablet Take 500-1,000 mg by mouth every 8 (eight) hours as needed (for pain).    AMBULATORY NON FORMULARY MEDICATION Medication Name: nitroglycerin 0.125% gel. Apply a pea size amount to your rectum three times daily x 6-8 weeks.   cyanocobalamin (,VITAMIN B-12,) 1000 MCG/ML injection 1 B12 injection every 7 days x 4 weeks then resume  every 14 days   etonogestrel-ethinyl estradiol (NUVARING) 0.12-0.015 MG/24HR vaginal ring Place 1 each vaginally every 28 (twenty-eight) days. Insert vaginally and leave in place for 3 consecutive weeks, then remove for 1 week.   HUMIRA PEN 40 MG/0.4ML PNKT INJECT 1 PEN UNDER THE SKIN EVERY 14 DAYS.   linaclotide (LINZESS) 145 MCG CAPS capsule Take 1 capsule (145 mcg total) by mouth daily before breakfast.   ondansetron (ZOFRAN ODT) 4 MG disintegrating tablet Take 1 tablet (4 mg total) by mouth every 8 (eight) hours as needed for nausea or vomiting.   TUBERCULIN SYR 1CC/26GX3/8" (B-D TB SYRINGE 1CC/26GX3/8") 26G X 3/8" 1 ML MISC Use every 3 weeks to administer Vitamin B12 injection IM   No facility-administered encounter medications on file as of 12/30/2021.    Allergies as of 12/30/2021 - Review Complete 12/30/2021  Allergen Reaction Noted   Adhesive [tape] Rash 08/29/2018    Past Medical History:  Diagnosis Date   B12 deficiency 2019   Crohn's disease ([place]) 12/2017    Past Surgical History:  Procedure Laterality Date   BREAST LUMPECTOMY Left    age 5yo   COLONOSCOPY  12/2017   mult ulcers in terminal ileum, othewise normal colon.  Dr. Harl Bowie    Family History  Problem Relation Age of Onset   Diabetes Maternal Aunt    Crohn's disease Mother    Hepatitis Father    Cancer Maternal Grandmother        lung   Drug abuse Brother    Heart disease Paternal Aunt  CHF   Cancer Paternal Grandmother        breast   Heart disease Paternal Grandmother        CHF   Colon cancer Neg Hx    Esophageal cancer Neg Hx    Rectal cancer Neg Hx     Social History   Socioeconomic History   Marital status: Married    Spouse name: Not on file   Number of children: 0   Years of education: Not on file   Highest education level: Not on file  Occupational History   Occupation: [employer]  Tobacco Use   Smoking status: Never   Smokeless tobacco: Never  Vaping Use   Vaping  Use: Never used  Substance and Sexual Activity   Alcohol use: Yes    Comment: 2-3 glasses weekly    Drug use: No   Sexual activity: Not on file  Other Topics Concern   Not on file  Social History Narrative   Works at [employer], 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  Exercise - peloton, yoga, weights.  01/2020.     Social Determinants of Health   Financial Resource Strain: Not on file  Food Insecurity: No Food Insecurity   Worried About Charity fundraiser in the Last Year: Never true   Ran Out of Food in the Last Year: Never true  Transportation Needs: No Transportation Needs   Lack of Transportation (Medical): No   Lack of Transportation (Non-Medical): No  Physical Activity: Not on file  Stress: Not on file  Social Connections: Not on file  Intimate Partner Violence: Not on file      Review of systems: All other review of systems negative except as mentioned in the HPI.   Physical Exam: Vitals:   12/30/21 0818  BP: (!) 92/54  Pulse: 67   Body mass index is 27.34 kg/m. Gen:      No acute distress HEENT:  sclera anicteric Abd:      soft, non-tender; no palpable masses, no distension Ext:    No edema Neuro: alert and oriented x 3 Psych: normal mood and affect  Data Reviewed:  Reviewed labs, radiology imaging, old records and pertinent past GI work up   Assessment and Plan/Recommendations:  41 year old very pleasant female with history of Crohn's disease, predominantly involving terminal ileum  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 Bowel habits improving with pelvic floor physical therapy and serial dilation of anal sphincter Continue Linzess Advised patient to titrate down MiraLAX to have 1-2 soft bowel movements daily and avoid watery diarrhea  We will hold off EGD and colonoscopy at this point given her symptoms are improving  Crohn's disease: Humira drug level is within therapeutic  range in January 2022 with undetectable antibodies, continue current regimen   B12 deficiency: Increase B12 injection frequency to weekly for 4 weeks and then continue maintenance injection every 2 weeks   Borderline iron deficiency: Continue multivitamin with oral iron  The patient was provided an opportunity to ask questions and all were answered. The patient agreed with the plan and demonstrated an understanding of the instructions.  Damaris Hippo , MD    CC: Tysinger, Camelia Eng, PA-C

## 2021-12-31 ENCOUNTER — Encounter: Payer: Self-pay | Admitting: Gastroenterology

## 2022-01-01 ENCOUNTER — Ambulatory Visit: Payer: BC Managed Care – PPO | Attending: Gastroenterology | Admitting: Physical Therapy

## 2022-01-01 ENCOUNTER — Encounter: Payer: Self-pay | Admitting: Physical Therapy

## 2022-01-01 ENCOUNTER — Other Ambulatory Visit: Payer: Self-pay

## 2022-01-01 DIAGNOSIS — R279 Unspecified lack of coordination: Secondary | ICD-10-CM | POA: Diagnosis present

## 2022-01-01 DIAGNOSIS — R252 Cramp and spasm: Secondary | ICD-10-CM | POA: Insufficient documentation

## 2022-01-01 DIAGNOSIS — M6281 Muscle weakness (generalized): Secondary | ICD-10-CM | POA: Diagnosis present

## 2022-01-01 NOTE — Therapy (Signed)
[facility] ?[facility] ?[place], [place], [address] ?Phone: [phone]   Fax:  [phone] ? ?Physical Therapy Treatment ? ?Patient Details  ?Name: Lacey Hess ?MRN: [id number] ?Date of Birth: 1981-09-10 ?Referring Provider (PT): Harl Bowie V ? ? ?Encounter Date: 01/01/2022 ? ? PT End of Session - 01/01/22 0804   ? ? Visit Number 7   ? Date for PT Re-Evaluation 01/28/22   ? Authorization Type BCBS   ? PT Start Time 0802   ? PT Stop Time 0835   ? PT Time Calculation (min) 33 min   ? Activity Tolerance Patient tolerated treatment well   ? Behavior During Therapy [facility] for tasks assessed/performed   ? ?  ?  ? ?  ? ? ?Past Medical History:  ?Diagnosis Date  ? B12 deficiency 2019  ? Crohn's disease ([place]) 12/2017  ? ? ?Past Surgical History:  ?Procedure Laterality Date  ? BREAST LUMPECTOMY Left   ? age 41yo  ? COLONOSCOPY  12/2017  ? mult ulcers in terminal ileum, othewise normal colon.  Dr. Harl Bowie  ? ? ?There were no vitals filed for this visit. ? ? Subjective Assessment - 01/01/22 0806   ? ? Subjective Still doing everything and not having any major changes.  I am on the 4th dilator   ? ?  ?  ? ?  ? ? ? ? ? ? ? ? ? ? ? ? ? ? ? ? ? ? ? ? [facility] Adult PT Treatment/Exercise - 01/01/22 0001   ? ?  ? Self-Care  ? Other Self-Care Comments  educated on HEP and doing more during breaks in the day to avoid sitting for long periods of time   ?  ? Manual Therapy  ? Manual therapy comments pt identity confimred and informed consent given   ? Soft tissue mobilization seated coccyx mobs   ? Internal Pelvic Floor vaginally to transverse peroneus and levators; trying breathing and bulging with TC   ? ?  ?  ? ?  ? ? ? ? ? ? ? ? ? ? ? ? PT Short Term Goals - 01/01/22 0840   ? ?  ? PT SHORT TERM GOAL #1  ? Title pt will be ind with initial HEP   ? Status Achieved   ? ?  ?  ? ?  ? ? ? ? PT Long Term Goals - 01/01/22 0834   ? ?  ? PT LONG TERM GOAL #1  ? Title Pt will  be able to have intercourse without discomfort   ? Status On-going   ?  ? PT LONG TERM GOAL #2  ? Title Pt will be able to have a BM in 10 minutes or less when she has the urge at least 75% of the time   ? Baseline better but BM is not complete   ? Status On-going   ?  ? PT LONG TERM GOAL #3  ? Title Pt will be ind with advanced HEP   ? Status On-going   ?  ? PT LONG TERM GOAL #4  ? Title Pt will have at least 75% less leakage of bowel or bladder due to less irritation in the abdomen from constipation   ? Baseline not having bladder leakage or issues   ? Status Partially Met   ? ?  ?  ? ?  ? ? ? ? ? ? ? ? Plan - 01/01/22 0824   ? ?  Clinical Impression Statement Pt tolerated STM and did this vaginally today.  She has the same response when breathing she is having difficutly relaxing when inhaling and she is tightening the muscles.  pt was given tactile cues to work on improved coordination and self care techniques to continue to work with dilators and more activity throughout the day   ? PT Treatment/Interventions ADLs/Self Care Home Management;Biofeedback;Cryotherapy;Electrical Stimulation;Moist Heat;Neuromuscular re-education;Patient/family education;Therapeutic exercise;Therapeutic activities;Taping;Passive range of motion;Dry needling;Manual techniques   ? PT Next Visit Plan internal STM rectally or vaginally; try abdominal fascial release release; breathing and bulging   ? PT Home Exercise Plan Access Code: [email or web address]   ? Consulted and Agree with Plan of Care Patient   ? ?  ?  ? ?  ? ? ?Patient will benefit from skilled therapeutic intervention in order to improve the following deficits and impairments:  Postural dysfunction, Decreased strength, Pain, Impaired flexibility, Increased muscle spasms, Decreased endurance, Decreased range of motion, Increased fascial restricitons, Impaired tone ? ?Visit Diagnosis: ?Muscle weakness (generalized) ? ?Unspecified lack of coordination ? ?Cramp and spasm ? ? ? ? ?Problem  List ?Patient Active Problem List  ? Diagnosis Date Noted  ? Crohn's disease with complication ([place]) 74/93/5521  ? Vaccine counseling 01/11/2020  ? High risk medication use 01/11/2020  ? Family history of breast cancer 01/05/2020  ? Women's annual routine gynecological examination 01/05/2020  ? Crohn's disease involving terminal ileum ([place]) 05/25/2018  ? Other constipation 12/03/2017  ? ? ?[name], PT ?01/01/2022, 8:42 AM ? ?[facility] ?[facility] [place] ?[place], [place], [address] ?Phone: [phone]   Fax:  [phone] ? ?Name: Lacey Hess ?MRN: [id number] ?Date of Birth: 02/13/1981 ? ? ? ?

## 2022-01-07 ENCOUNTER — Other Ambulatory Visit: Payer: Self-pay | Admitting: Gastroenterology

## 2022-01-09 ENCOUNTER — Encounter: Payer: BC Managed Care – PPO | Admitting: Gastroenterology

## 2022-01-16 ENCOUNTER — Ambulatory Visit: Payer: BC Managed Care – PPO | Admitting: Physical Therapy

## 2022-01-16 ENCOUNTER — Other Ambulatory Visit: Payer: Self-pay

## 2022-01-16 DIAGNOSIS — R279 Unspecified lack of coordination: Secondary | ICD-10-CM

## 2022-01-16 DIAGNOSIS — M6281 Muscle weakness (generalized): Secondary | ICD-10-CM

## 2022-01-16 DIAGNOSIS — R252 Cramp and spasm: Secondary | ICD-10-CM

## 2022-01-16 NOTE — Therapy (Signed)
[facility] ?[facility] ?[place], [place], [address] ?Phone: [phone]   Fax:  [phone] ? ?Physical Therapy Treatment ? ?Patient Details  ?Name: Lacey Hess ?MRN: [id number] ?Date of Birth: 1981-06-26 ?Referring Provider (PT): Harl Bowie V ? ? ?Encounter Date: 01/16/2022 ? ? PT End of Session - 01/16/22 0836   ? ? Visit Number 8   ? Date for PT Re-Evaluation 01/28/22   ? Authorization Type BCBS   ? PT Start Time 0802   ? PT Stop Time 0830   ? PT Time Calculation (min) 28 min   ? Activity Tolerance Patient tolerated treatment well   ? Behavior During Therapy [name] for tasks assessed/performed   ? ?  ?  ? ?  ? ? ?Past Medical History:  ?Diagnosis Date  ? B12 deficiency 2019  ? Crohn's disease ([place]) 12/2017  ? ? ?Past Surgical History:  ?Procedure Laterality Date  ? BREAST LUMPECTOMY Left   ? age 41yo  ? COLONOSCOPY  12/2017  ? mult ulcers in terminal ileum, othewise normal colon.  Dr. Harl Bowie  ? ? ?There were no vitals filed for this visit. ? ? Subjective Assessment - 01/16/22 0839   ? ? Subjective Still using the dilators.  I am having the same BM issue, but the RD might have me take fiber to firm up the stool if everything is going okay.   ? Patient Stated Goals be able to go more easily   ? Currently in Pain? No/denies   ? ?  ?  ? ?  ? ? ? ? ? ? ? ? ? ? ? ? ? ? ? ? ? ? ? ? [facility] Adult PT Treatment/Exercise - 01/16/22 0001   ? ?  ? Self-Care  ? Other Self-Care Comments  review toileting technique adding making "hard ball in stomach"   ?  ? Manual Therapy  ? Manual therapy comments pt identity confimred and informed consent given   ? Internal Pelvic Floor puborectalis and coccyx mobs internally, cocygeus - contract relax and breathing and bulging with TC   ? ?  ?  ? ?  ? ? ? ? ? ? ? ? ? ? ? ? PT Short Term Goals - 01/01/22 0840   ? ?  ? PT SHORT TERM GOAL #1  ? Title pt will be ind with initial HEP   ? Status Achieved   ? ?  ?  ? ?  ? ? ? ?  PT Long Term Goals - 01/01/22 0834   ? ?  ? PT LONG TERM GOAL #1  ? Title Pt will be able to have intercourse without discomfort   ? Status On-going   ?  ? PT LONG TERM GOAL #2  ? Title Pt will be able to have a BM in 10 minutes or less when she has the urge at least 75% of the time   ? Baseline better but BM is not complete   ? Status On-going   ?  ? PT LONG TERM GOAL #3  ? Title Pt will be ind with advanced HEP   ? Status On-going   ?  ? PT LONG TERM GOAL #4  ? Title Pt will have at least 75% less leakage of bowel or bladder due to less irritation in the abdomen from constipation   ? Baseline not having bladder leakage or issues   ? Status Partially Met   ? ?  ?  ? ?  ? ? ? ? ? ? ? ?  Plan - 01/16/22 0841   ? ? Clinical Impression Statement Pt is doing much better with bulging especially when using cues to make a hard ball in stomach while blowing.  Able to reach more muscles during internal soft tissue and pt was having no pain or TTP.  Pt will benefit from skilled PT to continue to work on improved soft tissue length.   ? PT Treatment/Interventions ADLs/Self Care Home Management;Biofeedback;Cryotherapy;Electrical Stimulation;Moist Heat;Neuromuscular re-education;Patient/family education;Therapeutic exercise;Therapeutic activities;Taping;Passive range of motion;Dry needling;Manual techniques   ? PT Next Visit Plan internal STM; try abdominal fascial release release again if needed; breathing and bulging   ? PT Home Exercise Plan Access Code: [email or web address]   ? Consulted and Agree with Plan of Care Patient   ? ?  ?  ? ?  ? ? ?Patient will benefit from skilled therapeutic intervention in order to improve the following deficits and impairments:  Postural dysfunction, Decreased strength, Pain, Impaired flexibility, Increased muscle spasms, Decreased endurance, Decreased range of motion, Increased fascial restricitons, Impaired tone ? ?Visit Diagnosis: ?Muscle weakness (generalized) ? ?Unspecified lack of  coordination ? ?Cramp and spasm ? ? ? ? ?Problem List ?Patient Active Problem List  ? Diagnosis Date Noted  ? Crohn's disease with complication ([place]) 73/41/9379  ? Vaccine counseling 01/11/2020  ? High risk medication use 01/11/2020  ? Family history of breast cancer 01/05/2020  ? Women's annual routine gynecological examination 01/05/2020  ? Crohn's disease involving terminal ileum ([place]) 05/25/2018  ? Other constipation 12/03/2017  ? ? ?[name], PT ?01/16/2022, 8:45 AM ? ?[place] ?[facility] ?[place], [place], [address] ?Phone: [phone]   Fax:  [phone] ? ?Name: Lacey Hess ?MRN: [id number] ?Date of Birth: Jun 04, 1981 ? ? ? ?

## 2022-01-18 ENCOUNTER — Encounter: Payer: Self-pay | Admitting: Gastroenterology

## 2022-01-22 NOTE — Telephone Encounter (Signed)
Please order TSH, CRP and fecal cal protectin.  Repeating iron panel and b12 now may not provide information that we may change management, its better to recheck few months later. Given recurrent persistent symptoms we can consider EGD and colonoscopy, schedule next available appt. Thanks ?

## 2022-01-23 ENCOUNTER — Encounter: Payer: Self-pay | Admitting: Physical Therapy

## 2022-01-23 ENCOUNTER — Other Ambulatory Visit: Payer: Self-pay

## 2022-01-23 ENCOUNTER — Ambulatory Visit: Payer: BC Managed Care – PPO | Admitting: Physical Therapy

## 2022-01-23 DIAGNOSIS — R279 Unspecified lack of coordination: Secondary | ICD-10-CM

## 2022-01-23 DIAGNOSIS — K5000 Crohn's disease of small intestine without complications: Secondary | ICD-10-CM

## 2022-01-23 DIAGNOSIS — K5902 Outlet dysfunction constipation: Secondary | ICD-10-CM

## 2022-01-23 DIAGNOSIS — R252 Cramp and spasm: Secondary | ICD-10-CM

## 2022-01-23 DIAGNOSIS — M6281 Muscle weakness (generalized): Secondary | ICD-10-CM | POA: Diagnosis not present

## 2022-01-23 NOTE — Therapy (Signed)
[facility] ?[facility] [place] ?[place], [place], [address] ?Phone: [phone]   Fax:  [phone] ? ?Physical Therapy Treatment ? ?Patient Details  ?Name: Lacey Hess ?MRN: [id number] ?Date of Birth: Oct 24, 1981 ?Referring Provider (PT): Harl Bowie V ? ? ?Encounter Date: 01/23/2022 ? ? PT End of Session - 01/23/22 0803   ? ? Visit Number 9   ? Date for PT Re-Evaluation 01/28/22   ? Authorization Type BCBS   ? PT Start Time 0800   ? PT Stop Time 0840   ? PT Time Calculation (min) 40 min   ? Activity Tolerance Patient tolerated treatment well   ? Behavior During Therapy [facility] for tasks assessed/performed   ? ?  ?  ? ?  ? ? ?Past Medical History:  ?Diagnosis Date  ? B12 deficiency 2019  ? Crohn's disease ([place]) 12/2017  ? ? ?Past Surgical History:  ?Procedure Laterality Date  ? BREAST LUMPECTOMY Left   ? age 14yo  ? COLONOSCOPY  12/2017  ? mult ulcers in terminal ileum, othewise normal colon.  Dr. Harl Bowie  ? ? ?There were no vitals filed for this visit. ? ? Subjective Assessment - 01/23/22 0810   ? ? Subjective I am going all the time and wiping a lot, but still really constipated.  Had a lot of pain last night and couldn't stand up.   ? Patient Stated Goals be able to go more easily   ? Currently in Pain? Yes   ? Pain Score 5    ? Pain Location Abdomen   ? Pain Orientation Lower   ? Pain Descriptors / Indicators Moaning   ? Pain Type Chronic pain   ? Multiple Pain Sites No   ? ?  ?  ? ?  ? ? ? ? ? ? ? ? ? ? ? ? ? ? ? ? ? ? ? ? [facility] Adult PT Treatment/Exercise - 01/23/22 0001   ? ?  ? Manual Therapy  ? Manual therapy comments pt identity confimred and informed consent given   ? Myofascial Release abdominal release ascendingand descending colon   ? Internal Pelvic Floor puborectalis and fascial release around coccyx internal and external   ? ?  ?  ? ?  ? ?Self care: educated on bowel retraining ? ? ? ? ? ? ? ? ? ? PT Short Term Goals - 01/01/22 0840   ? ?   ? PT SHORT TERM GOAL #1  ? Title pt will be ind with initial HEP   ? Status Achieved   ? ?  ?  ? ?  ? ? ? ? PT Long Term Goals - 01/01/22 0834   ? ?  ? PT LONG TERM GOAL #1  ? Title Pt will be able to have intercourse without discomfort   ? Status On-going   ?  ? PT LONG TERM GOAL #2  ? Title Pt will be able to have a BM in 10 minutes or less when she has the urge at least 75% of the time   ? Baseline better but BM is not complete   ? Status On-going   ?  ? PT LONG TERM GOAL #3  ? Title Pt will be ind with advanced HEP   ? Status On-going   ?  ? PT LONG TERM GOAL #4  ? Title Pt will have at least 75% less leakage of bowel or bladder due to less irritation in the  abdomen from constipation   ? Baseline not having bladder leakage or issues   ? Status Partially Met   ? ?  ?  ? ?  ? ? ? ? ? ? ? ? Plan - 01/23/22 0923   ? ? Clinical Impression Statement Pt was more tight and puborectalis very TTP.  Some release was achieved with STM.  Pt was educated on bowel retraining and regular timing of her meals to improve regularity.  Educated on using peribottle or badette for improved skin.   ? PT Treatment/Interventions ADLs/Self Care Home Management;Biofeedback;Cryotherapy;Electrical Stimulation;Moist Heat;Neuromuscular re-education;Patient/family education;Therapeutic exercise;Therapeutic activities;Taping;Passive range of motion;Dry needling;Manual techniques   ? PT Next Visit Plan re-assess internal STM; continue abdominal fascial release; craniosacral techniques   ? PT Home Exercise Plan Access Code: [email or web address]   ? Consulted and Agree with Plan of Care Patient   ? ?  ?  ? ?  ? ? ?Patient will benefit from skilled therapeutic intervention in order to improve the following deficits and impairments:  Postural dysfunction, Decreased strength, Pain, Impaired flexibility, Increased muscle spasms, Decreased endurance, Decreased range of motion, Increased fascial restricitons, Impaired tone ? ?Visit Diagnosis: ?Muscle weakness  (generalized) ? ?Unspecified lack of coordination ? ?Cramp and spasm ? ? ? ? ?Problem List ?Patient Active Problem List  ? Diagnosis Date Noted  ? Crohn's disease with complication ([place]) 73/41/9379  ? Vaccine counseling 01/11/2020  ? High risk medication use 01/11/2020  ? Family history of breast cancer 01/05/2020  ? Women's annual routine gynecological examination 01/05/2020  ? Crohn's disease involving terminal ileum ([place]) 05/25/2018  ? Other constipation 12/03/2017  ? ? ?[name], PT ?01/23/2022, 10:25 AM ? ? ?[facility] [place] ?[place], [place], [address] ?Phone: [phone]   Fax:  [phone] ? ?Name: Lacey Hess ?MRN: [id number] ?Date of Birth: [birth year] ? ? ? ?

## 2022-01-27 ENCOUNTER — Other Ambulatory Visit (INDEPENDENT_AMBULATORY_CARE_PROVIDER_SITE_OTHER): Payer: BC Managed Care – PPO

## 2022-01-27 DIAGNOSIS — K5000 Crohn's disease of small intestine without complications: Secondary | ICD-10-CM | POA: Diagnosis not present

## 2022-01-27 DIAGNOSIS — K5902 Outlet dysfunction constipation: Secondary | ICD-10-CM

## 2022-01-27 LAB — TSH: TSH: 1.22 u[IU]/mL (ref 0.35–5.50)

## 2022-01-27 LAB — HIGH SENSITIVITY CRP: CRP, High Sensitivity: 2.64 mg/L (ref 0.000–5.000)

## 2022-01-29 ENCOUNTER — Telehealth: Payer: Self-pay | Admitting: Gastroenterology

## 2022-01-29 NOTE — Telephone Encounter (Signed)
Called back. No answer. Left a message of my return call on her voicemail. ?

## 2022-01-29 NOTE — Telephone Encounter (Signed)
Inbound call from patient dietitian wanting to speak with nurse about patient. Stated that patient has been working on her diet to help her constipation. Possibly wanting to do a CT scan to rule out Crohns.  Please advise.  ? ?[phone] ?

## 2022-01-30 ENCOUNTER — Ambulatory Visit: Payer: BC Managed Care – PPO | Admitting: Physical Therapy

## 2022-01-30 ENCOUNTER — Encounter: Payer: Self-pay | Admitting: Physical Therapy

## 2022-01-30 DIAGNOSIS — R252 Cramp and spasm: Secondary | ICD-10-CM

## 2022-01-30 DIAGNOSIS — R279 Unspecified lack of coordination: Secondary | ICD-10-CM

## 2022-01-30 DIAGNOSIS — M6281 Muscle weakness (generalized): Secondary | ICD-10-CM | POA: Diagnosis not present

## 2022-01-30 NOTE — Telephone Encounter (Signed)
CT is unlikely to provide significant information especially if it is a mild anorectal stricture.  She is scheduled for colonoscopy for further evaluation.  Thank you ?

## 2022-01-30 NOTE — Therapy (Signed)
[place] ?[facility] ?[address] ?[place], [place], [address] ?Phone: [phone]   Fax:  [phone] ? ?Physical Therapy Treatment ? ?Patient Details  ?Name: Lacey Hess ?MRN: [id number] ?Date of Birth: 06/04/1981 ?Referring Provider (PT): Nandigam, Kavitha V ? ? ?Encounter Date: 01/30/2022 ? ? PT End of Session - 01/30/22 0804   ? ? Visit Number 10   ? Date for PT Re-Evaluation 01/28/22   ? Authorization Type BCBS   ? PT Start Time 0800   ? PT Stop Time 0840   ? PT Time Calculation (min) 40 min   ? Activity Tolerance Patient tolerated treatment well   ? Behavior During Therapy WFL for tasks assessed/performed   ? ?  ?  ? ?  ? ? ?Past Medical History:  ?Diagnosis Date  ? B12 deficiency 2019  ? Crohn's disease (HCC) 12/2017  ? ? ?Past Surgical History:  ?Procedure Laterality Date  ? BREAST LUMPECTOMY Left   ? age 21yo  ? COLONOSCOPY  12/2017  ? mult ulcers in terminal ileum, othewise normal colon.  Dr. Kavitha Nandigam  ? ? ?There were no vitals filed for this visit. ? ? Subjective Assessment - 01/30/22 0805   ? ? Subjective Yesterday morning was the last BM I had.  I had a blowout last week but overall no changes.   ? Patient Stated Goals be able to go more easily   ? Currently in Pain? No/denies   ? ?  ?  ? ?  ? ? ? ? ? ? ? ? ? ? ? ? ? ? ? ? ? ? ? ? [facility] Adult PT Treatment/Exercise - 01/30/22 0001   ? ?  ? Manual Therapy  ? Manual therapy comments pt identity confimred and informed consent given   ? Soft tissue mobilization cervical and suboccipital; obdurater attatchments around the ischial tuberotisy   ? Internal Pelvic Floor puborectalis and slightly inter coccygeus   ? ?  ?  ? ?  ? ? ? Trigger Point Dry Needling - 01/30/22 0001   ? ? Consent Given? Yes   ? Education Handout Provided Previously provided   ? Muscles Treated Back/Hip Gluteus minimus;Gluteus medius;Gluteus maximus;Piriformis   ? Gluteus Minimus Response Twitch response elicited;Palpable increased  muscle length   ? Gluteus Medius Response Twitch response elicited;Palpable increased muscle length   ? Gluteus Maximus Response Twitch response elicited;Palpable increased muscle length   ? Piriformis Response Twitch response elicited;Palpable increased muscle length   ? ?  ?  ? ?  ? ? ? ? ? ? ? ? ? ? PT Short Term Goals - 01/01/22 0840   ? ?  ? PT SHORT TERM GOAL #1  ? Title pt will be ind with initial HEP   ? Status Achieved   ? ?  ?  ? ?  ? ? ? ? PT Long Term Goals - 01/30/22 0910   ? ?  ? PT LONG TERM GOAL #1  ? Title Pt will be able to have intercourse without discomfort   ? Status Not Met   ?  ? PT LONG TERM GOAL #2  ? Title Pt will be able to have a BM in 10 minutes or less when she has the urge at least 75% of the time   ? Status Not Met   ?  ? PT LONG TERM GOAL #3  ? Title Pt will be ind with advanced HEP   ? Status Achieved   ?  ?   PT LONG TERM GOAL #4  ? Title Pt will have at least 75% less leakage of bowel or bladder due to less irritation in the abdomen from constipation   ? Baseline not having bladder leakage or issues   ? Status Partially Met   ? ?  ?  ? ?  ? ? ? ? ? ? ? ? Plan - 01/30/22 0904   ? ? Clinical Impression Statement Pt was able to tolerate muscle palpation all the way to the coccygeus to today.  Other than pelvic floor muscles getting slightly less tight at each visit, the symptoms remain the same.  Therefore, pt will d/c as she is going to see another specialist to find out if there are any other underlying issues.  She is ind with using dilators and HEP to continue to address musculoskeletal issues.   ? PT Treatment/Interventions ADLs/Self Care Home Management;Biofeedback;Cryotherapy;Electrical Stimulation;Moist Heat;Neuromuscular re-education;Patient/family education;Therapeutic exercise;Therapeutic activities;Taping;Passive range of motion;Dry needling;Manual techniques   ? PT Next Visit Plan d/c   ? PT Home Exercise Plan Access Code: [email or web address]   ? Consulted and Agree with Plan of Care  Patient   ? ?  ?  ? ?  ? ? ?Patient will benefit from skilled therapeutic intervention in order to improve the following deficits and impairments:  Postural dysfunction, Decreased strength, Pain, Impaired flexibility, Increased muscle spasms, Decreased endurance, Decreased range of motion, Increased fascial restricitons, Impaired tone ? ?Visit Diagnosis: ?Muscle weakness (generalized) ? ?Unspecified lack of coordination ? ?Cramp and spasm ? ? ? ? ?Problem List ?Patient Active Problem List  ? Diagnosis Date Noted  ? Crohn's disease with complication ([place]) 63/87/5643  ? Vaccine counseling 01/11/2020  ? High risk medication use 01/11/2020  ? Family history of breast cancer 01/05/2020  ? Women's annual routine gynecological examination 01/05/2020  ? Crohn's disease involving terminal ileum ([place]) 05/25/2018  ? Other constipation 12/03/2017  ? ? ?[name], PT ?01/30/2022, 9:11 AM ? ?[place] ?[facility] ?[place], [place], [address] ?Phone: [phone]   Fax:  [phone] ? ?Name: Lacey Hess ?MRN: [id number] ?Date of Birth: [birth year] ? ?PHYSICAL THERAPY DISCHARGE SUMMARY ? ?Visits from Start of Care: 10 ? ?Current functional level related to goals / functional outcomes: ?see above goals ?  ?Remaining deficits: ?See above ?  ?Education / Equipment: ?HEP  ? ?Patient agrees to discharge. Patient goals were partially met. Patient is being discharged due to lack of progress. ? ?Gustavus Bryant, PT ?01/30/22 9:12 AM ? ? ? ?

## 2022-01-30 NOTE — Telephone Encounter (Signed)
Spoke with [place]. Patient had given permission to do so.  ?[place] is a Administrator, sports whose focus is IBD patients. She wanted to share her notes with Dr Silverio Decamp. ?The patient has made significant dietary changes without any improvement to her issues of constipation. Further reports patient has followed through with pelvic floor PT, using Miralax BID and anal dilation.  ?She asks could her constipation be due to stricture? Would a CT be helpful? ?

## 2022-02-04 ENCOUNTER — Other Ambulatory Visit: Payer: BC Managed Care – PPO

## 2022-02-04 DIAGNOSIS — K5000 Crohn's disease of small intestine without complications: Secondary | ICD-10-CM

## 2022-02-04 DIAGNOSIS — K5902 Outlet dysfunction constipation: Secondary | ICD-10-CM

## 2022-02-06 ENCOUNTER — Encounter: Payer: Self-pay | Admitting: Physical Therapy

## 2022-02-07 LAB — CALPROTECTIN, FECAL: Calprotectin, Fecal: 21 ug/g (ref 0–120)

## 2022-02-20 ENCOUNTER — Ambulatory Visit: Payer: BC Managed Care – PPO | Admitting: Gastroenterology

## 2022-02-23 ENCOUNTER — Encounter: Payer: Self-pay | Admitting: Gastroenterology

## 2022-02-23 ENCOUNTER — Telehealth: Payer: Self-pay | Admitting: Gastroenterology

## 2022-02-23 ENCOUNTER — Other Ambulatory Visit: Payer: Self-pay

## 2022-02-23 DIAGNOSIS — K5000 Crohn's disease of small intestine without complications: Secondary | ICD-10-CM

## 2022-02-23 DIAGNOSIS — R1110 Vomiting, unspecified: Secondary | ICD-10-CM

## 2022-02-23 DIAGNOSIS — R1084 Generalized abdominal pain: Secondary | ICD-10-CM

## 2022-02-23 DIAGNOSIS — R198 Other specified symptoms and signs involving the digestive system and abdomen: Secondary | ICD-10-CM

## 2022-02-23 NOTE — Telephone Encounter (Signed)
Scheduling contacted the patient. CT 02/25/22. ?

## 2022-02-23 NOTE — Telephone Encounter (Signed)
Spoke with patient. ?She reports she felt uncomfortable the past 2 days. She felt an urgency of her bowels to the point she kept close proximity to a bathroom. No diarrhea at that time, but she was bloated and gassy. She began vomiting in the early morning today. The need to vomit is what woke her up. She began having diarrhea. She last vomited about 3 to 4 hours ago. She has epigastric burning. Denies body aches, fever or rash. Agrees to take Zofran now.  ?Please advise. ?

## 2022-02-23 NOTE — Telephone Encounter (Signed)
Patient called this morning stating she was having "extreme symptoms" and asked to be seen today by Dr. Silverio Decamp or someone else.  There is nothing available.  She is experiencing a lot of pain, burning in her stomach, nausea & vomiting.  She is afraid there may be some type of stricture or narrowing.  She is scheduled to see Dr. Silverio Decamp 5/3 at 2:30.  If she can't be seen today, please call and advise if there's anything she can be doing in the interim.  Thank you. ?

## 2022-02-23 NOTE — Telephone Encounter (Signed)
Approval for CT abd/pelvis with contrast until 5/23  ? ?Imaging Facility ?[facility] ?[address] ? ?Order ID: [id number]       Authorized ?  ?Approval Valid Through: 02/23/2022 - 03/24/2022 ?

## 2022-02-23 NOTE — Telephone Encounter (Signed)
Called Lacey Hess back.  She is passing gas but has not had any bowel movements.  She feels distended with generalized abdominal discomfort.  She had vomiting last night but none since this morning. ?Her presentation is concerning for possible bowel obstruction.  Advised patient to come to ER if she develops worsening abdominal distention, abdominal pain and if she is unable to pass any air ? ?We will obtain CT abdomen and pelvis with contrast to further evaluate and exclude any small bowel obstruction. ?Based on CT findings, may need to proceed with EGD and colonoscopy for further evaluation of symptoms to be scheduled after reviewing CT. ?

## 2022-02-25 ENCOUNTER — Encounter: Payer: Self-pay | Admitting: Gastroenterology

## 2022-02-25 ENCOUNTER — Ambulatory Visit (HOSPITAL_BASED_OUTPATIENT_CLINIC_OR_DEPARTMENT_OTHER)
Admission: RE | Admit: 2022-02-25 | Discharge: 2022-02-25 | Disposition: A | Discharge status: Home / Self Care | Payer: BC Managed Care – PPO | Source: Ambulatory Visit | Attending: Gastroenterology | Admitting: Gastroenterology

## 2022-02-25 ENCOUNTER — Encounter (HOSPITAL_BASED_OUTPATIENT_CLINIC_OR_DEPARTMENT_OTHER): Payer: Self-pay

## 2022-02-25 DIAGNOSIS — R1084 Generalized abdominal pain: Secondary | ICD-10-CM | POA: Diagnosis present

## 2022-02-25 DIAGNOSIS — R1110 Vomiting, unspecified: Secondary | ICD-10-CM | POA: Diagnosis present

## 2022-02-25 DIAGNOSIS — K5000 Crohn's disease of small intestine without complications: Secondary | ICD-10-CM | POA: Insufficient documentation

## 2022-02-25 DIAGNOSIS — R198 Other specified symptoms and signs involving the digestive system and abdomen: Secondary | ICD-10-CM | POA: Insufficient documentation

## 2022-02-25 MED ORDER — IOHEXOL 300 MG/ML  SOLN
100.0000 mL | Freq: Once | INTRAMUSCULAR | Status: AC | PRN
  Administered 2022-02-25: 75 mL via INTRAVENOUS

## 2022-02-26 ENCOUNTER — Other Ambulatory Visit: Payer: Self-pay | Admitting: Gastroenterology

## 2022-03-04 ENCOUNTER — Ambulatory Visit: Payer: BC Managed Care – PPO | Admitting: Gastroenterology

## 2022-03-04 ENCOUNTER — Encounter: Payer: Self-pay | Admitting: Gastroenterology

## 2022-03-04 VITALS — BP 110/62 | HR 70 | Ht 65.5 in | Wt 170.0 lb

## 2022-03-04 DIAGNOSIS — R109 Unspecified abdominal pain: Secondary | ICD-10-CM | POA: Diagnosis not present

## 2022-03-04 DIAGNOSIS — K921 Melena: Secondary | ICD-10-CM

## 2022-03-04 DIAGNOSIS — K589 Irritable bowel syndrome without diarrhea: Secondary | ICD-10-CM

## 2022-03-04 DIAGNOSIS — R140 Abdominal distension (gaseous): Secondary | ICD-10-CM

## 2022-03-04 DIAGNOSIS — K50019 Crohn's disease of small intestine with unspecified complications: Secondary | ICD-10-CM | POA: Diagnosis not present

## 2022-03-04 DIAGNOSIS — R112 Nausea with vomiting, unspecified: Secondary | ICD-10-CM

## 2022-03-04 MED ORDER — PROMETHAZINE HCL 12.5 MG RE SUPP
12.5000 mg | Freq: Four times a day (QID) | RECTAL | 5 refills | Status: DC | PRN
Start: 2022-03-04 — End: 2022-06-02

## 2022-03-04 MED ORDER — NORTRIPTYLINE HCL 10 MG PO CAPS: 10.0000 mg | ORAL_CAPSULE | Freq: Every day | ORAL | 3 refills | Status: DC

## 2022-03-04 MED ORDER — RIFAXIMIN 550 MG PO TABS: 550.0000 mg | ORAL_TABLET | Freq: Three times a day (TID) | ORAL | 0 refills | Status: AC

## 2022-03-04 NOTE — Patient Instructions (Addendum)
Dr Silverio Decamp recommends that you complete a bowel purge (to clean out your bowels). Please do the following: ?Purchase a bottle of Miralax over the counter as well as a box of 5 mg dulcolax tablets. ?Take 4 dulcolax tablets. ?Wait 1 hour. ?You will then drink 6-8 capfuls of Miralax mixed in an adequate amount of water/juice/gatorade (you may choose which of these liquids to drink) over the next 2-3 hours. ?You should expect results within 1 to 6 hours after completing the bowel purge.  ? ?We have sent the following medications to your pharmacy for you to pick up at your convenience: Phenergan Suppositories,   Xifaxan  ? ? ?Due to recent changes in healthcare laws, you may see the results of your imaging and laboratory studies on MyChart before your provider has had a chance to review them.  We understand that in some cases there may be results that are confusing or concerning to you. Not all laboratory results come back in the same time frame and the provider may be waiting for multiple results in order to interpret others.  Please give [place] 48 hours in order for your provider to thoroughly review all the results before contacting the office for clarification of your results.   ? ?If you are age 32 or older, your body mass index should be between 23-30. Your Body mass index is 27.86 kg/m?Marland Kitchen If this is out of the aforementioned range listed, please consider follow up with your Primary Care Provider. ? ?If you are age 24 or younger, your body mass index should be between 19-25. Your Body mass index is 27.86 kg/m?Marland Kitchen If this is out of the aformentioned range listed, please consider follow up with your Primary Care Provider.  ? ?________________________________________________________ ? ?The [facility] providers would like to encourage you to use [facility] to communicate with providers for non-urgent requests or questions.  Due to long hold times on the telephone, sending your provider a message by [facility] may be a faster and  more efficient way to get a response.  Please allow 48 business hours for a response.  Please remember that this is for non-urgent requests.  ?_______________________________________________________  ? ?CAPSULE ENDOSCOPY ?PATIENT INSTRUCTION SHEET ? ?Lacey Hess ?1981-09-15 ?[id number] ? ? ?05/15 Seven (7) days prior to capsule endoscopy stop taking iron supplements and carafate. ? ?5/20 Two (2) days prior to capsule endoscopy stop taking aspirin or any arthritis drugs. ? ?5/21 Day before capsule endoscopy purchase a 238 gram bottle of Miralax from the laxative section of your drug store, and a 32 oz. bottle of Gatorade (no red).   ? ?5/21 One (1) day prior to capsule endoscopy: ?Stop smoking. ?Eat a regular diet until 12:00 Noon. ?After 12:00 Noon take only the following: ?Black coffee  Jell-O (no fruit or red Jell-o) ?Water   Bouillon (chicken or beef) ?7-Up   Cranberry Juice ?Tea   Kool-Aid Popsicle (not red) ?Sprite   Coke ?Ginger Ale  Pepsi ?Mountain Dew Gatorade ?At 6:00 pm the evening before your appointment, drink 7 capfuls (105 grams) of Miralax with 32 oz. Gatorade. Drink 8 oz every 15 minutes until gone. ?Nothing to eat or drink after midnight except medications with a sip of water. ? ?5/22 Day of capsule endoscopy: ? ?No medications for 2 hours prior to your test. ? ?Please arrive at [facility]  3rd floor patient registration area by 8:15am on: 03/23/2022.  ? ?For any questions: ?Call [employer] at [phone] and ask to speak with one of  the capsule endoscopy nurses. ? ?YOU WILL NEED TO RETURN THE EQUIPMENT AT 4 PM ON THE DAY OF THE PROCEDURE.  PLEASE KEEP THIS IN MIND WHEN SCHEDULING.  ? ? ? ?Small Bowel Capsule Endoscopy ? ?What you should know: ?Small Bowel capsule endoscopy is a procedure that takes pictures of the inside of your small intestine (bowel).  Your small bowel connects to your stomach on one end, and your large bowel (colon) on the other.  A capsule endoscopy is done by  swallowing a pill size camera.  The capsule moves through your stomach and into your small bowel, where pictures are taken.  ? ?You may need a small bowel capsule endoscopy if you have symptoms, such as blood in your stool, chronic stomach pain, and diarrhea.  The pictures may show if you have growths, swelling, and bleeding area in you small bowel.  A capsule endoscopy may also show if diseases such as Crohn's or celiac disease are causing your symptoms.  Having a small bowel capsule endoscopy may help you and your caregiver learn the cause of your symptoms.  Learning what is causing your symptoms allows you to receive needed treatment and prevent further problems. ?Risks: ?You may have stomach pain during your procedure.   ?The pictures taken by the capsule may not be clear.   ?The pictures may not show the cause of your symptoms.   ?You may need another endoscopy procedure.  ? The capsule may get trapped in your esophagus or intestines. You may need surgery or additional procedures to remove the capsule from your body. ? ? ? ?Before your procedure: ?You will be instructed to stop certain prescription medications or over- the -counter medications prior to the procedure.   ?The day before your scheduled appointment you will need to be on a restricted diet and will need to drink a bowel prep that will clean out your bowels.   ?The day of the procedure: ?You may drive yourself to the procedure.   ?You will need to plan on 2 trips to the office on the day of the procedure. ?Morning: ?Plan to be at the office about 45 minutes. ?The morning of the procedure a sensor belt and recorder will be placed on you.  You will wear this for 8 hours.  (The sensor belt transfers pictures of your small bowel to the recorder.)   You will be given a pill-sized capsule endoscope to swallow.  Once you swallow the capsule it will travel through your body the same way food does, constantly taking pictures along the way.  The capsule takes  2-3 pictures a second.   ?Once you have left the office you may go about your normal day with a few exceptions: You may not go near a MRI machine or a radio or television towers; You need to avoid other patients having capsule endoscopy; You will be given a written diet to follow for the day. ? ?Afternoon: ?You will need to be return to the office at your designated time. ?The sensors belt will be removed ?You will need to be at the office about 15 minutes. ?  ? ?I appreciate the  opportunity to care for you ? ?Thank You  ? ?Harl Bowie , MD  ?

## 2022-03-04 NOTE — Progress Notes (Signed)
? ?       ? ?Lacey Hess    [id number]    [birth year] ? ?Primary Care Physician:Tysinger, Camelia Eng, PA-C ? ?Referring Physician: Carlena Hurl, PA-C ?[address] ?[place],  [place] [address] ? ? ?Chief complaint: Abdominal pain, nausea, vomiting, constipation ? ?HPI: ? ?41 year old very pleasant female with Crohn's disease here for follow-up visit ? ?She is continuing to struggle with constipation, having intermittent episodes of diarrhea.  She does not feel she is evacuating completely.  She is experiencing generalized abdominal bloating, pain and cramping.  She is continuing to have intermittent episodes of nausea and vomiting ? ?CT abdomen pelvis February 25, 2022: Negative for any acute intra-abdominal pathology.  No bowel obstruction or thickening to suggest acute Crohn's flare ? ?Right upper quadrant ultrasound December 16, 2021 was negative for any acute pathology, no cholelithiasis or cholecystitis ?  ?She is no longer following with dietitian or physical therapy, according to [facility] both of them felt that they have done everything and did not have any other options available to improve her symptoms ?  ?Mother diagnosed with Crohn's disease in 2021 and and has ileostomy with high output.  She is worried if she ever has to undergo a colostomy or an ileostomy ?  ?GI history: ?On Humira biweekly injection.    Humira antibody level detected at 28 and drug level 16 on September 06, 2018.    ?Colonoscopy December 06, 2017 showed multiple aphthous ulcers in terminal ileum, biopsy consistent with erosions and active inflammation. ?CT enterography April 2019 with contrast showed mild wall thickening and abnormal mucosal enhancement in terminal ileum highly suspicious for Crohn's disease. ?  ? ? ?Outpatient Encounter Medications as of 03/04/2022  ?Medication Sig  ? acetaminophen (TYLENOL) 500 MG tablet Take 500-1,000 mg by mouth every 8 (eight) hours as needed (for pain).   ? cyanocobalamin (,VITAMIN B-12,) 1000  MCG/ML injection 1 B12 injection every 7 days x 4 weeks then resume every 14 days  ? etonogestrel-ethinyl estradiol (NUVARING) 0.12-0.015 MG/24HR vaginal ring Place 1 each vaginally every 28 (twenty-eight) days. Insert vaginally and leave in place for 3 consecutive weeks, then remove for 1 week.  ? HUMIRA PEN 40 MG/0.4ML PNKT INJECT 1 PEN UNDER THE SKIN EVERY 14 DAYS.  ? LINZESS 145 MCG CAPS capsule TAKE 1 CAPSULE(145 MCG) BY MOUTH DAILY BEFORE BREAKFAST  ? Multiple Vitamin (MULTIVITAMIN) tablet Take 1 tablet by mouth daily.  ? ondansetron (ZOFRAN ODT) 4 MG disintegrating tablet Take 1 tablet (4 mg total) by mouth every 8 (eight) hours as needed for nausea or vomiting.  ? polyethylene glycol (MIRALAX / GLYCOLAX) 17 g packet Take 34 g by mouth 2 (two) times daily.  ? TUBERCULIN SYR 1CC/26GX3/8" (B-D TB SYRINGE 1CC/26GX3/8") 26G X 3/8" 1 ML MISC Use every 3 weeks to administer Vitamin B12 injection IM  ? AMBULATORY NON FORMULARY MEDICATION Medication Name: nitroglycerin 0.125% gel. Apply a pea size amount to your rectum three times daily x 6-8 weeks. (Patient not taking: Reported on 03/04/2022)  ? ?No facility-administered encounter medications on file as of 03/04/2022.  ? ? ?Allergies as of 03/04/2022 - Review Complete 03/04/2022  ?Allergen Reaction Noted  ? Adhesive [tape] Rash 08/29/2018  ? ? ?Past Medical History:  ?Diagnosis Date  ? B12 deficiency 2019  ? Crohn's disease ([address]) 12/2017  ? ? ?Past Surgical History:  ?Procedure Laterality Date  ? BREAST LUMPECTOMY Left   ? age 42yo  ? COLONOSCOPY  12/2017  ? mult ulcers  in terminal ileum, othewise normal colon.  Dr. Harl Bowie  ? ? ?Family History  ?Problem Relation Age of Onset  ? Crohn's disease Mother   ? Hepatitis Father   ? Drug abuse Brother   ? Cancer Maternal Grandmother   ?     lung  ? Cancer Paternal Grandmother   ?     breast  ? Heart disease Paternal Grandmother   ?     CHF  ? Diabetes Maternal Aunt   ? Heart disease Paternal Aunt   ?     CHF  ? Colon  cancer Neg Hx   ? Esophageal cancer Neg Hx   ? Rectal cancer Neg Hx   ? Stomach cancer Neg Hx   ? ? ?Social History  ? ?Socioeconomic History  ? Marital status: Married  ?  Spouse name: Not on file  ? Number of children: 0  ? Years of education: Not on file  ? Highest education level: Not on file  ?Occupational History  ? Occupation: [employer]  ?Tobacco Use  ? Smoking status: Never  ? Smokeless tobacco: Never  ?Vaping Use  ? Vaping Use: Never used  ?Substance and Sexual Activity  ? Alcohol use: Yes  ?  Comment: 2-3 glasses weekly   ? Drug use: No  ? Sexual activity: Not on file  ?Other Topics Concern  ? Not on file  ?Social History Narrative  ? Works at [employer], Scientist, physiological of Students, Geographical information systems officer.  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  Exercise - peloton, yoga, weights.  01/2020.    ? ?Social Determinants of Health  ? ?Financial Resource Strain: Not on file  ?Food Insecurity: Not on file  ?Transportation Needs: Not on file  ?Physical Activity: Not on file  ?Stress: Not on file  ?Social Connections: Not on file  ?Intimate Partner Violence: Not on file  ? ? ? ? ?Review of systems: ?All other review of systems negative except as mentioned in the HPI. ? ? ?Physical Exam: ?Vitals:  ? 03/04/22 1423  ?BP: 110/62  ?Pulse: 70  ?SpO2: 98%  ? ?Body mass index is 27.86 kg/m?. ?Gen:      No acute distress ?HEENT:  sclera anicteric ?Abd:      soft, non-tender; no palpable masses, no distension ?Ext:    No edema ?Neuro: alert and oriented x 3 ?Psych: normal mood and affect ? ?Data Reviewed: ? ?Reviewed labs, radiology imaging, old records and pertinent past GI work up ? ? ?Assessment and Plan/Recommendations: ? ?41 year old very pleasant female with history of Crohn's disease, predominantly involving terminal ileum ? ?Generalized abdominal discomfort, nausea, vomiting and irregular bowel habits ?CT abdomen pelvis negative for any acute pathology ?We will schedule small bowel pill camera to exclude small bowel Crohn's disease exacerbating her  symptoms ?Humira drug level is within therapeutic range in January 2022 with undetectable antibodies, continue current regimen ? ?Course of Xifaxan 550 mg 3 times daily for possible small intestinal bacterial overgrowth ? ?Use Phenergan suppositories for severe nausea if needed ? 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 ?IBS: Use nortriptyline 10 mg daily at bedtime as a neuromodulator and symptom management for IBS ?  ?B12 deficiency:  continue maintenance injection every 2 weeks ? ?Return in 4 to 6 weeks after pill camera ?  ? ?This visit required 40  minutes of patient care (this includes precharting, chart review, review of results, face-to-face time used for counseling as well as treatment plan and follow-up. The patient was provided an opportunity to ask questions and all were answered. The patient agreed with the plan and demonstrated an understanding of the instructions. ? ?K. Denzil Magnuson , MD ?  ? ?CC: Tysinger, Camelia Eng, PA-C ? ? ?

## 2022-03-13 ENCOUNTER — Encounter: Payer: Self-pay | Admitting: Gastroenterology

## 2022-03-23 ENCOUNTER — Telehealth: Payer: Self-pay | Admitting: Gastroenterology

## 2022-03-23 ENCOUNTER — Encounter: Payer: Self-pay | Admitting: Gastroenterology

## 2022-03-23 ENCOUNTER — Ambulatory Visit (INDEPENDENT_AMBULATORY_CARE_PROVIDER_SITE_OTHER): Payer: BC Managed Care – PPO | Admitting: Gastroenterology

## 2022-03-23 DIAGNOSIS — K50919 Crohn's disease, unspecified, with unspecified complications: Secondary | ICD-10-CM | POA: Diagnosis not present

## 2022-03-23 DIAGNOSIS — D500 Iron deficiency anemia secondary to blood loss (chronic): Secondary | ICD-10-CM

## 2022-03-23 NOTE — Telephone Encounter (Signed)
Patient called and the 7/24 appt with Dr. Silverio Decamp will not work for her as she will be out of town.  There are no other open appointments for her.  Please call patient and advise.  Thank you.

## 2022-03-23 NOTE — Progress Notes (Unsigned)
Pt here foe capsule endo, completed prep with no issues. Pt swallowed capsule and did not have any difficulty. She knows she may have clear liquids in 2 hours and snack in 2 more hours, and to return at 4pm to have monitor removed. She knows to call the office if she thinks she has not passed the capsule.  Capsule ID#[id number] IZX#[id number] EXP:  07/25/23

## 2022-03-24 NOTE — Telephone Encounter (Signed)
Appointment moved. Patient aware.

## 2022-03-30 ENCOUNTER — Other Ambulatory Visit: Payer: Self-pay | Admitting: Gastroenterology

## 2022-04-14 ENCOUNTER — Telehealth: Payer: Self-pay | Admitting: Gastroenterology

## 2022-04-14 NOTE — Telephone Encounter (Signed)
Lacey Hess and discussed results of small bowel pill camera, overall was unremarkable She continues to have intermittent abdominal cramping, discomfort and nausea.  Plan to continue current medication regimen.  She has been on low-dose nortriptyline for about 5 weeks now.  She has follow-up office visit in July.  If symptoms are persistent, will consider either increasing nortriptyline dose or switch to alternate therapy. Lacey Hess agreed with the plan.

## 2022-04-15 ENCOUNTER — Ambulatory Visit: Payer: BC Managed Care – PPO | Admitting: Gastroenterology

## 2022-05-20 ENCOUNTER — Ambulatory Visit: Payer: BC Managed Care – PPO | Admitting: Gastroenterology

## 2022-05-25 ENCOUNTER — Ambulatory Visit: Payer: BC Managed Care – PPO | Admitting: Gastroenterology

## 2022-05-26 ENCOUNTER — Ambulatory Visit: Payer: BC Managed Care – PPO | Admitting: Gastroenterology

## 2022-05-31 ENCOUNTER — Ambulatory Visit
Admission: RE | Admit: 2022-05-31 | Discharge: 2022-05-31 | Disposition: A | Discharge status: Home / Self Care | Payer: BC Managed Care – PPO | Source: Ambulatory Visit | Attending: Emergency Medicine | Admitting: Emergency Medicine

## 2022-05-31 ENCOUNTER — Encounter: Payer: Self-pay | Admitting: Gastroenterology

## 2022-05-31 VITALS — BP 121/76 | HR 85 | Temp 98.1°F | Resp 16

## 2022-05-31 DIAGNOSIS — K645 Perianal venous thrombosis: Secondary | ICD-10-CM | POA: Diagnosis not present

## 2022-05-31 MED ORDER — NITROGLYCERIN 0.4 % RE OINT
TOPICAL_OINTMENT | RECTAL | 1 refills | Status: DC
Start: 2022-05-31 — End: 2023-06-17

## 2022-05-31 MED ORDER — HYDROCORTISONE ACETATE 25 MG RE SUPP: 25.0000 mg | Freq: Two times a day (BID) | RECTAL | 0 refills | Status: DC

## 2022-05-31 MED ORDER — HYDROCORTISONE ACE-PRAMOXINE 1-1 % EX CREA
1.0000 | TOPICAL_CREAM | Freq: Two times a day (BID) | CUTANEOUS | 0 refills | Status: DC
Start: 2022-05-31 — End: 2022-06-02

## 2022-05-31 NOTE — ED Provider Notes (Signed)
[facility]    CSN: [id number] Arrival date & time: 05/31/22  9629    HISTORY   Chief Complaint  Patient presents with   Hemorrhoids    Severe pain - Entered by patient   HPI Lacey Hess is a pleasant, 41 y.o. female who presents to urgent care today. Patient presents with a recurrent hemorrhoid, states last episode was a month ago and was resolved with over-the-counter Preparation H.  Patient states the hemorrhoid has returned in the same place and is more painful and not responsive to Preparation H at this time.  Patient reports a history of Crohn's disease, has a follow-up with GI on August 7.  Patient states she struggles with constipation, takes Linzess and 4 capful of MiraLAX daily.  Patient denies fever, aches, chills, nausea, vomiting, diarrhea.  Patient states she has discomfort with urination because it places pressure on the area.  Patient states she attempted to reduce the hemorrhoid digitally last night which caused her so much pain that she became sweaty, dizzy and nauseated and had to lie down.  The history is provided by the patient.   Past Medical History:  Diagnosis Date   B12 deficiency 2019   Crohn's disease ([place]) 12/2017   Patient Active Problem List   Diagnosis Date Noted   Crohn's disease with complication ([place]) 52/84/1324   Vaccine counseling 01/11/2020   High risk medication use 01/11/2020   Family history of breast cancer 01/05/2020   Women's annual routine gynecological examination 01/05/2020   Crohn's disease involving terminal ileum ([place]) 05/25/2018   Other constipation 12/03/2017   Past Surgical History:  Procedure Laterality Date   BREAST LUMPECTOMY Left    age 89yo   COLONOSCOPY  12/2017   mult ulcers in terminal ileum, othewise normal colon.  Dr. Harl Bowie   OB History   No obstetric history on file.    Home Medications    Prior to Admission medications   Medication Sig Start Date End Date Taking? Authorizing  Provider  acetaminophen (TYLENOL) 500 MG tablet Take 500-1,000 mg by mouth every 8 (eight) hours as needed (for pain).     [provider]  AMBULATORY NON FORMULARY MEDICATION Medication Name: nitroglycerin 0.125% gel. Apply a pea size amount to your rectum three times daily x 6-8 weeks. Patient not taking: Reported on 03/04/2022 10/06/21   Mauri Pole, MD  cyanocobalamin (,VITAMIN B-12,) 1000 MCG/ML injection 1 B12 injection every 7 days x 4 weeks then resume every 14 days 12/29/21   Mauri Pole, MD  etonogestrel-ethinyl estradiol (NUVARING) 0.12-0.015 MG/24HR vaginal ring Place 1 each vaginally every 28 (twenty-eight) days. Insert vaginally and leave in place for 3 consecutive weeks, then remove for 1 week. 07/23/21   Rasch, Anderson Malta I, NP  HUMIRA PEN 40 MG/0.4ML PNKT INJECT 1 PEN UNDER THE SKIN EVERY 14 DAYS. 02/26/22   Mauri Pole, MD  LINZESS 145 MCG CAPS capsule TAKE 1 CAPSULE(145 MCG) BY MOUTH DAILY BEFORE BREAKFAST 03/31/22   Mauri Pole, MD  Multiple Vitamin (MULTIVITAMIN) tablet Take 1 tablet by mouth daily.    [provider]  nortriptyline (PAMELOR) 10 MG capsule Take 1 capsule (10 mg total) by mouth at bedtime. 03/04/22   Mauri Pole, MD  ondansetron (ZOFRAN ODT) 4 MG disintegrating tablet Take 1 tablet (4 mg total) by mouth every 8 (eight) hours as needed for nausea or vomiting. 08/27/21   Mauri Pole, MD  polyethylene glycol (MIRALAX / GLYCOLAX) 17 g  packet Take 34 g by mouth 2 (two) times daily.    [provider]  promethazine (PHENERGAN) 12.5 MG suppository Place 1 suppository (12.5 mg total) rectally every 6 (six) hours as needed for nausea or vomiting. 03/04/22   Mauri Pole, MD  TUBERCULIN SYR 1CC/26GX3/8" (B-D TB SYRINGE 1CC/26GX3/8") 26G X 3/8" 1 ML MISC Use every 3 weeks to administer Vitamin B12 injection IM 04/11/21   Mauri Pole, MD    Family History Family History  Problem Relation Age of  Onset   Crohn's disease Mother    Hepatitis Father    Drug abuse Brother    Cancer Maternal Grandmother        lung   Cancer Paternal Grandmother        breast   Heart disease Paternal Grandmother        CHF   Diabetes Maternal Aunt    Heart disease Paternal Aunt        CHF   Colon cancer Neg Hx    Esophageal cancer Neg Hx    Rectal cancer Neg Hx    Stomach cancer Neg Hx    Social History Social History   Tobacco Use   Smoking status: Never   Smokeless tobacco: Never  Vaping Use   Vaping Use: Never used  Substance Use Topics   Alcohol use: Yes    Comment: 2-3 glasses weekly    Drug use: No   Allergies   Adhesive [tape]  Review of Systems Review of Systems Pertinent findings revealed after performing a 14 point review of systems has been noted in the history of present illness.  Physical Exam Triage Vital Signs ED Triage Vitals  Enc Vitals Group     BP 08/29/21 0827 (!) 147/82     Pulse Rate 08/29/21 0827 72     Resp 08/29/21 0827 18     Temp 08/29/21 0827 98.3 F (36.8 C)     Temp Source 08/29/21 0827 Oral     SpO2 08/29/21 0827 98 %     Weight --      Height --      Head Circumference --      Peak Flow --      Pain Score 08/29/21 0826 5     Pain Loc --      Pain Edu? --      Excl. in [place]? --   No data found.  Updated Vital Signs BP 121/76 (BP Location: Right Arm)   Pulse 85   Temp 98.1 F (36.7 C) (Oral)   Resp 16   LMP 05/25/2022 (Exact Date)   SpO2 98%   Physical Exam Vitals and nursing note reviewed. Exam conducted with a chaperone present.  Constitutional:      General: She is not in acute distress.    Appearance: Normal appearance.  HENT:     Head: Normocephalic and atraumatic.  Eyes:     Pupils: Pupils are equal, round, and reactive to light.  Cardiovascular:     Rate and Rhythm: Normal rate and regular rhythm.  Pulmonary:     Effort: Pulmonary effort is normal.     Breath sounds: Normal breath sounds.  Genitourinary:    Exam  position: Knee-chest position.     Comments: Limited DRE revealed palpable internal hemorrhoid at 7:00 that is exquisitely tender to palpation.  No concern for anal fissure at this time.  Hemoccult declined due to patient comfort. Musculoskeletal:  General: Normal range of motion.     Cervical back: Normal range of motion and neck supple.  Skin:    General: Skin is warm and dry.  Neurological:     General: No focal deficit present.     Mental Status: She is alert and oriented to person, place, and time. Mental status is at baseline.  Psychiatric:        Mood and Affect: Mood normal.        Behavior: Behavior normal.        Thought Content: Thought content normal.        Judgment: Judgment normal.     Visual Acuity Right Eye Distance:   Left Eye Distance:   Bilateral Distance:    Right Eye Near:   Left Eye Near:    Bilateral Near:     UC Couse / Diagnostics / Procedures:     Radiology No results found.  Procedures Procedures (including critical care time) EKG  Pending results:  Labs Reviewed - No data to display  Medications Ordered in UC: Medications - No data to display  UC Diagnoses / Final Clinical Impressions(s)   I have reviewed the triage vital signs and the nursing notes.  Pertinent labs & imaging results that were available during my care of the patient were reviewed by me and considered in my medical decision making (see chart for details).    Final diagnoses:  Hemorrhoids, internal, thrombosed   Due to poor interface between patient's insurance and her EMR, I not unable to tell whether or not her insurance will pay for nitroglycerin rectal ointment which is my first choice of treatment given her excruciating pain with palpation.  Patient advised that I also sent prescriptions for a compound of Preparation H and hydrocortisone as well as hydrocortisone suppositories.  Patient also advised that I have notified the pharmacist to fill the one that is  best covered by her insurance and that my personal preference is the nitroglycerin.  Patient verbalized understanding of this plan.  Patient also agreed to discuss referral to colorectal surgery given grade 4 thrombosed internal hemorrhoid requiring surgical excision.  ED precautions advised.  ED Prescriptions     Medication Sig Dispense Auth. Provider   Nitroglycerin 0.4 % OINT Apply pea-sized amount to the hemorrhoid twice daily as needed for pain until advised to discontinue. 30 g Lynden Oxford Scales, PA-C   pramoxine-hydrocortisone (PROCTOCREAM-HC) 1-1 % rectal cream Place 1 Application rectally 2 (two) times daily. 30 g Lynden Oxford Scales, PA-C   hydrocortisone (ANUSOL-HC) 25 MG suppository Place 1 suppository (25 mg total) rectally 2 (two) times daily for 7 days. 14 suppository Lynden Oxford Scales, [place]      PDMP not reviewed this encounter.  Pending results:  Labs Reviewed - No data to display  Discharge Instructions:   Discharge Instructions      I was unable to be certain what your insurance would pay for 1 I was writing her prescriptions so I sent you 3 different options and I am going to pray that one of them is affordable.  The first is nitroglycerin which I believe is your best option.  This is a vasodilator that we will open up the blood vessels and help blood flow away from the hemorrhoid.  The second is ProctoCream which contains both the medication Preparation H as well as a steroid which will shrink surrounding tissues.  The third is just hydrocortisone at a high dose.  I advised the pharmacist to  fill the 1 that is best covered and to disregard the other 2.  If her prior authorization is required, I will be happy to complete that for you. Please discuss more definitive treatment with your gastroenterologist at your following appointment.  Should you need them, I have enclosed the names of two colorectal surgical groups, one in [place] in one  [place] that may be able to see you fairly soon.  Thank you for visiting urgent care today.      Disposition Upon Discharge:  Condition: stable for discharge home  Patient presented with an acute illness with associated systemic symptoms and significant discomfort requiring urgent management. In my opinion, this is a condition that a prudent lay person (someone who possesses an average knowledge of health and medicine) may potentially expect to result in complications if not addressed urgently such as respiratory distress, impairment of bodily function or dysfunction of bodily organs.   Routine symptom specific, illness specific and/or disease specific instructions were discussed with the patient and/or caregiver at length.   As such, the patient has been evaluated and assessed, work-up was performed and treatment was provided in alignment with urgent care protocols and evidence based medicine.  Patient/parent/caregiver has been advised that the patient may require follow up for further testing and treatment if the symptoms continue in spite of treatment, as clinically indicated and appropriate.  Patient/parent/caregiver has been advised to return to the [facility] or PCP if no better; to PCP or the Emergency Department if new signs and symptoms develop, or if the current signs or symptoms continue to change or worsen for further workup, evaluation and treatment as clinically indicated and appropriate  The patient will follow up with their current PCP if and as advised. If the patient does not currently have a PCP we will assist them in obtaining one.   The patient may need specialty follow up if the symptoms continue, in spite of conservative treatment and management, for further workup, evaluation, consultation and treatment as clinically indicated and appropriate.   Patient/parent/caregiver verbalized understanding and agreement of plan as discussed.  All questions were addressed during  visit.  Please see discharge instructions below for further details of plan.  This office note has been dictated using Museum/gallery curator.  Unfortunately, this method of dictation can sometimes lead to typographical or grammatical errors.  I apologize for your inconvenience in advance if this occurs.  Please do not hesitate to reach out to me if clarification is needed.      Lynden Oxford Scales, PA-C 05/31/22 1100

## 2022-05-31 NOTE — ED Triage Notes (Signed)
Pt states Hemorid pain since yesterday,  has tried OTC medications with no relief.

## 2022-05-31 NOTE — Discharge Instructions (Signed)
I was unable to be certain what your insurance would pay for 1 I was writing her prescriptions so I sent you 3 different options and I am going to pray that one of them is affordable.  The first is nitroglycerin which I believe is your best option.  This is a vasodilator that we will open up the blood vessels and help blood flow away from the hemorrhoid.  The second is ProctoCream which contains both the medication Preparation H as well as a steroid which will shrink surrounding tissues.  The third is just hydrocortisone at a high dose.  I advised the pharmacist to fill the 1 that is best covered and to disregard the other 2.  If her prior authorization is required, I will be happy to complete that for you. Please discuss more definitive treatment with your gastroenterologist at your following appointment.  Should you need them, I have enclosed the names of two colorectal surgical groups, one in [place] in one [place] that may be able to see you fairly soon.  Thank you for visiting urgent care today.

## 2022-06-01 ENCOUNTER — Telehealth: Payer: Self-pay | Admitting: Gastroenterology

## 2022-06-01 ENCOUNTER — Telehealth: Payer: Self-pay

## 2022-06-01 NOTE — Telephone Encounter (Signed)
Patient called and wanted to speak with you.  Please call.

## 2022-06-01 NOTE — Telephone Encounter (Signed)
Patient has an appointment tomorrow 06/02/22. She has sent the following message as an FYI  Hi Dr. Silverio Decamp and Eustaquio Maize-  In advance of our appt Tuesday I wanted to let you know I just left come urgent care for severe pain from a hemorrhoid. The doctor did a rectal exam and stated it's internal grade 4 and recommended surgery. I'm sure you can see this in the notes but I wanted to alert you to it for our appointment.  Lacey Hess

## 2022-06-01 NOTE — Telephone Encounter (Signed)
Spoke with the patient. She is very uncomfortable. Appointment tomorrow. Rectal pain is making her feel very bad. She has nitroglycerin ointment given to her by the provider she saw at the urgent care. Offered samples of Recticare to try. She will come pick them up.

## 2022-06-01 NOTE — Telephone Encounter (Signed)
[place], Thanks

## 2022-06-02 ENCOUNTER — Ambulatory Visit: Payer: BC Managed Care – PPO | Admitting: Gastroenterology

## 2022-06-02 ENCOUNTER — Encounter: Payer: Self-pay | Admitting: Gastroenterology

## 2022-06-02 VITALS — BP 108/64 | HR 84 | Ht 65.0 in | Wt 169.0 lb

## 2022-06-02 DIAGNOSIS — K5902 Outlet dysfunction constipation: Secondary | ICD-10-CM | POA: Diagnosis not present

## 2022-06-02 DIAGNOSIS — M6289 Other specified disorders of muscle: Secondary | ICD-10-CM | POA: Diagnosis not present

## 2022-06-02 DIAGNOSIS — K645 Perianal venous thrombosis: Secondary | ICD-10-CM

## 2022-06-02 DIAGNOSIS — K50919 Crohn's disease, unspecified, with unspecified complications: Secondary | ICD-10-CM

## 2022-06-02 DIAGNOSIS — K5904 Chronic idiopathic constipation: Secondary | ICD-10-CM

## 2022-06-02 MED ORDER — MOTEGRITY 2 MG PO TABS: 1.0000 | ORAL_TABLET | Freq: Every day | ORAL | 1 refills | Status: DC

## 2022-06-02 MED ORDER — HYDROCORTISONE ACE-PRAMOXINE 1-1 % EX CREA: 1.0000 | TOPICAL_CREAM | Freq: Two times a day (BID) | CUTANEOUS | 1 refills | Status: DC

## 2022-06-02 NOTE — Progress Notes (Unsigned)
Lacey Hess    [id number]    [birth year]  Primary Care Physician:Tysinger, Camelia Eng, PA-C  Referring Physician: Carlena Hurl, PA-C [address] [place],  [place] [address]   Chief complaint: Hemorrhoids, rectal pain  HPI:  41 year old very pleasant female with Crohn's disease here for follow-up visit with complaints of rectal pain  She developed severe rectal discomfort this past weekend, has been progressively worse in the past month.  She feels discomfort in the rectum from hemorrhoids when she sits down.  Denies any rectal bleeding.  She went to urgent care this weekend, was prescribed hydrocortisone cream.  She did sitz bath with no significant improvement   She is continuing to struggle with constipation, having intermittent episodes of diarrhea.  She does not feel she is evacuating completely.  She is experiencing generalized abdominal bloating, pain and cramping.  She is continuing to have intermittent episodes of nausea and vomiting  Small bowel video capsule Mar 23, 2022: Incomplete exam.  Negative for active inflammation or Crohn's disease   CT abdomen pelvis February 25, 2022: Negative for any acute intra-abdominal pathology.  No bowel obstruction or thickening to suggest acute Crohn's flare   Right upper quadrant ultrasound December 16, 2021 was negative for any acute pathology, no cholelithiasis or cholecystitis   She is no longer following with dietitian or physical therapy, according to [facility] both of them felt that they have done everything and did not have any other options available to improve her symptoms   Mother diagnosed with Crohn's disease in 2021 and and has ileostomy with high output.  She is worried if she ever has to undergo a colostomy or an ileostomy   GI history: On Humira biweekly injection.    Humira antibody level detected at 28 and drug level 16 on September 06, 2018.    Colonoscopy December 06, 2017 showed multiple aphthous ulcers  in terminal ileum, biopsy consistent with erosions and active inflammation. CT enterography April 2019 with contrast showed mild wall thickening and abnormal mucosal enhancement in terminal ileum highly suspicious for Crohn's disease.   Outpatient Encounter Medications as of 06/02/2022  Medication Sig   acetaminophen (TYLENOL) 500 MG tablet Take 500-1,000 mg by mouth every 8 (eight) hours as needed (for pain).    AMBULATORY NON FORMULARY MEDICATION Medication Name: nitroglycerin 0.125% gel. Apply a pea size amount to your rectum three times daily x 6-8 weeks. (Patient not taking: Reported on 03/04/2022)   cyanocobalamin (,VITAMIN B-12,) 1000 MCG/ML injection 1 B12 injection every 7 days x 4 weeks then resume every 14 days   etonogestrel-ethinyl estradiol (NUVARING) 0.12-0.015 MG/24HR vaginal ring Place 1 each vaginally every 28 (twenty-eight) days. Insert vaginally and leave in place for 3 consecutive weeks, then remove for 1 week.   HUMIRA PEN 40 MG/0.4ML PNKT INJECT 1 PEN UNDER THE SKIN EVERY 14 DAYS.   hydrocortisone (ANUSOL-HC) 25 MG suppository Place 1 suppository (25 mg total) rectally 2 (two) times daily for 7 days.   LINZESS 145 MCG CAPS capsule TAKE 1 CAPSULE(145 MCG) BY MOUTH DAILY BEFORE BREAKFAST   Multiple Vitamin (MULTIVITAMIN) tablet Take 1 tablet by mouth daily.   Nitroglycerin 0.4 % OINT Apply pea-sized amount to the hemorrhoid twice daily as needed for pain until advised to discontinue.   nortriptyline (PAMELOR) 10 MG capsule Take 1 capsule (10 mg total) by mouth at bedtime.   ondansetron (ZOFRAN ODT) 4 MG  disintegrating tablet Take 1 tablet (4 mg total) by mouth every 8 (eight) hours as needed for nausea or vomiting.   polyethylene glycol (MIRALAX / GLYCOLAX) 17 g packet Take 34 g by mouth 2 (two) times daily.   pramoxine-hydrocortisone (PROCTOCREAM-HC) 1-1 % rectal cream Place 1 Application rectally 2 (two) times daily.   promethazine (PHENERGAN) 12.5 MG suppository Place 1  suppository (12.5 mg total) rectally every 6 (six) hours as needed for nausea or vomiting.   TUBERCULIN SYR 1CC/26GX3/8" (B-D TB SYRINGE 1CC/26GX3/8") 26G X 3/8" 1 ML MISC Use every 3 weeks to administer Vitamin B12 injection IM   No facility-administered encounter medications on file as of 06/02/2022.    Allergies as of 06/02/2022 - Review Complete 05/31/2022  Allergen Reaction Noted   Adhesive [tape] Rash 08/29/2018    Past Medical History:  Diagnosis Date   B12 deficiency 2019   Crohn's disease ([place]) 12/2017    Past Surgical History:  Procedure Laterality Date   BREAST LUMPECTOMY Left    age 73yo   COLONOSCOPY  12/2017   mult ulcers in terminal ileum, othewise normal colon.  Dr. Harl Bowie    Family History  Problem Relation Age of Onset   Crohn's disease Mother    Hepatitis Father    Drug abuse Brother    Cancer Maternal Grandmother        lung   Cancer Paternal Grandmother        breast   Heart disease Paternal Grandmother        CHF   Diabetes Maternal Aunt    Heart disease Paternal Aunt        CHF   Colon cancer Neg Hx    Esophageal cancer Neg Hx    Rectal cancer Neg Hx    Stomach cancer Neg Hx     Social History   Socioeconomic History   Marital status: Married    Spouse name: Not on file   Number of children: 0   Years of education: Not on file   Highest education level: Not on file  Occupational History   Occupation: [employer]  Tobacco Use   Smoking status: Never   Smokeless tobacco: Never  Vaping Use   Vaping Use: Never used  Substance and Sexual Activity   Alcohol use: Yes    Comment: 2-3 glasses weekly    Drug use: No   Sexual activity: Not on file  Other Topics Concern   Not on file  Social History Narrative   Works at [employer], 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  Exercise - peloton, yoga, weights.  01/2020.     Social Determinants of Health   Financial Resource Strain: Not on file  Food Insecurity: No  Food Insecurity (02/10/2021)   Hunger Vital Sign    Worried About Running Out of Food in the Last Year: Never true    Ran Out of Food in the Last Year: Never true  Transportation Needs: No Transportation Needs (02/10/2021)   PRAPARE - Hydrologist (Medical): No    Lack of Transportation (Non-Medical): No  Physical Activity: Not on file  Stress: Not on file  Social Connections: Not on file  Intimate Partner Violence: Not on file      Review of systems: All other review of systems negative except as mentioned in the HPI.   Physical Exam: Vitals:   06/02/22 1117  BP: 108/64  Pulse: 84   Body  mass index is 28.12 kg/m. Gen:      No acute distress HEENT:  sclera anicteric Abd:      soft, non-tender; no palpable masses, no distension Ext:    No edema Neuro: alert and oriented x 3 Psych: normal mood and affect Rectal exam: increased anal sphincter tone, thrombosed external right posterior hemorrhoid    Data Reviewed:  Reviewed labs, radiology imaging, old records and pertinent past GI work up   Assessment and Plan/Recommendations:  41 year old very pleasant female with history of Crohn's disease, predominantly involving terminal ileum with complaints of rectal pain secondary to thrombosed external hemorrhoids  Advised patient to use sitz bath 2-3 times daily for next 3 to 5 days Small pea-sized amount of hydrocortisone HC cream per rectum twice daily for 5 to 7 days If continues to have persistent discomfort, will need referral to colorectal surgery for management Avoid excessive straining during defecation Use Benefiber 1 tablespoon twice daily with meals  [facility] to [facility] for [facility] physical therapy   Generalized abdominal discomfort, nausea, vomiting and irregular bowel habits CT abdomen pelvis negative for any acute pathology Humira drug level is within therapeutic range in January 2022 with undetectable antibodies,  continue current regimen  IBS: Use nortriptyline 10 mg daily at bedtime as a neuromodulator and symptom management for IBS     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 The patient was provided an opportunity to ask questions and all were answered. The patient agreed with the plan and demonstrated an understanding of the instructions.  KDamaris Hippo, MD    CC: Tysinger, DCamelia Eng PA-C

## 2022-06-02 NOTE — Patient Instructions (Addendum)
How to Take a [employer] A sitz bath is a warm water bath that may be used to care for your rectum, genital area, or the area between your rectum and genitals (perineum). In a sitz bath, the water only comes up to your hips and covers your buttocks. A sitz bath may be done in a bathtub or with a portable sitz bath that fits over the toilet. Your health care provider may recommend a sitz bath to help: Relieve pain and discomfort after delivering a baby. Relieve pain and itching from hemorrhoids or anal fissures. Relieve pain after certain surgeries. Relax muscles that are sore or tight. How to take a sitz bath Take 3-4 sitz baths a day, or as many as told by your health care provider. Bathtub sitz bath To take a sitz bath in a bathtub: Partially fill a bathtub with warm water. The water should be deep enough to cover your hips and buttocks when you are sitting in the tub. Follow your health care provider's instructions if you are told to put medicine in the water. Sit in the water. Open the tub drain a little, and leave it open during your bath. Turn on the warm water again, enough to replace the water that is draining out. Keep the water running throughout your bath. This helps keep the water at the right level and temperature. Soak in the water for 15-20 minutes, or as long as told by your health care provider. When you are done, be careful when you stand up. You may feel dizzy. After the sitz bath, pat yourself dry. Do not rub your skin to dry it.  Over-the-toilet sitz bath To take a sitz bath with an over-the-toilet basin: Follow the manufacturer's instructions. Fill the basin with warm water. Follow your health care provider's instructions if you were told to put medicine in the water. Sit on the seat. Make sure the water covers your buttocks and perineum. Soak in the water for 15-20 minutes, or as long as told by your health care provider. After the sitz bath, pat yourself dry. Do not  rub your skin to dry it. Clean and dry the basin between uses. Discard the basin if it cracks, or according to the manufacturer's instructions.  Contact a health care provider if: Your pain or itching gets worse. Do not continue with sitz baths if your symptoms get worse. You have new symptoms. Do not continue with sitz baths until you talk with your health care provider. Summary A sitz bath is a warm water bath in which the water only comes up to your hips and covers your buttocks. A sitz bath may help relieve pain and discomfort after delivering a baby. It also may help with pain and itching from hemorrhoids or anal fissures, or pain after certain surgeries. It can also help to relax muscles that are sore or tight. Take 3-4 sitz baths a day, or as many as told by your health care provider. Soak in the water for 15-20 minutes. Do not continue with sitz baths if your symptoms get worse. This information is not intended to replace advice given to you by your health care provider. Make sure you discuss any questions you have with your health care provider. Document Revised: 07/02/2020 Document Reviewed: 07/04/2020 Elsevier Patient Education  [place].  We will refer you to [facility], you will be contacted with those appointments  We have sent the following medications to your pharmacy for you to pick  up at your convenience: Nortriptyline Motegrity  Hydrocortisone HC   Use OTC Recticare every 6 hours as needed  If you are age 24 or older, your body mass index should be between 23-30. Your Body mass index is 28.12 kg/m. If this is out of the aforementioned range listed, please consider follow up with your Primary Care Provider.  If you are age 43 or younger, your body mass index should be between 19-25. Your Body mass index is 28.12 kg/m. If this is out of the aformentioned range listed, please consider follow up with your Primary Care Provider.    ________________________________________________________  The [facility] providers would like to encourage you to use [facility] to communicate with providers for non-urgent requests or questions.  Due to long hold times on the telephone, sending your provider a message by [facility] may be a faster and more efficient way to get a response.  Please allow 48 business hours for a response.  Please remember that this is for non-urgent requests.  _______________________________________________________   I appreciate the  opportunity to care for you  Thank You   Harl Bowie , MD

## 2022-06-03 ENCOUNTER — Telehealth: Payer: Self-pay | Admitting: *Deleted

## 2022-06-03 NOTE — Telephone Encounter (Signed)
Patient was seen in the office on 8/1. She needs a referral to [facility] for Thrombosed Hemorrhoid and also referral to [facility] for PT.   I will send in when the office note is complete

## 2022-06-04 ENCOUNTER — Encounter: Payer: Self-pay | Admitting: Gastroenterology

## 2022-06-04 NOTE — Telephone Encounter (Signed)
Faxed to both places office notes today

## 2022-06-04 NOTE — Telephone Encounter (Signed)
Done, thanks

## 2022-06-05 ENCOUNTER — Other Ambulatory Visit (HOSPITAL_COMMUNITY): Payer: Self-pay

## 2022-06-05 ENCOUNTER — Telehealth: Payer: Self-pay | Admitting: Pharmacy Technician

## 2022-06-05 ENCOUNTER — Encounter: Payer: Self-pay | Admitting: Gastroenterology

## 2022-06-05 MED ORDER — LUBIPROSTONE 24 MCG PO CAPS: 24.0000 ug | ORAL_CAPSULE | Freq: Two times a day (BID) | ORAL | 0 refills | Status: DC

## 2022-06-05 NOTE — Telephone Encounter (Addendum)
Patient Advocate Encounter Received a fax regarding Prior Authorization from [facility] for [place]. Authorization has been DENIED because PT HAS NOT TRIED/FAILED AMITIZA AND LINZESS.  DENIAL LETTER HAS BEEN SCANNED IN.  Received notification from [place] that prior authorization for MOTEGRITY 2MG is required.   PA submitted on 8.4.23 Key [id number]  Status is pending    Luciano Cutter, CPhT Patient Advocate Phone: [phone]

## 2022-06-05 NOTE — Telephone Encounter (Signed)
Please send Rx for Amitiza 56mg BID X 30 days, please inform patient that per insurance she will have to fail Amitiza before motegrity is approved. Thanks

## 2022-06-05 NOTE — Telephone Encounter (Signed)
PA has been submitted, however pt does not have a try/fail on file for Amitiza. Pt plan requires tried alternatives of 2 in a class of 2 alternatives. Pt has tried Linzess. Telephone Encounter has been created, and will updated when insurance has made a decision.

## 2022-06-23 ENCOUNTER — Ambulatory Visit: Payer: Self-pay | Admitting: Medical

## 2022-06-26 ENCOUNTER — Telehealth: Payer: Self-pay

## 2022-06-26 ENCOUNTER — Other Ambulatory Visit: Payer: Self-pay | Admitting: Gastroenterology

## 2022-06-26 NOTE — Telephone Encounter (Signed)
Records not received by [facility].  Faxed office note, demographic and insurance.

## 2022-06-26 NOTE — Telephone Encounter (Signed)
Records faxed to [facility] for referral pelvic floor PT.

## 2022-07-08 ENCOUNTER — Encounter: Payer: Self-pay | Admitting: Internal Medicine

## 2022-07-19 ENCOUNTER — Encounter: Payer: Self-pay | Admitting: Gastroenterology

## 2022-07-20 ENCOUNTER — Telehealth: Payer: Self-pay

## 2022-07-20 ENCOUNTER — Other Ambulatory Visit (HOSPITAL_COMMUNITY): Payer: Self-pay

## 2022-07-20 NOTE — Telephone Encounter (Signed)
I received a message from my patient saying the Humira needs the PA renewed. I have not received anything from the specialty pharmacy. She says that the pharmacy tells her they have sent [place] the request. She is due her injection tomorrow. Is there anything about this on your end?

## 2022-07-20 NOTE — Telephone Encounter (Signed)
Patient Advocate Encounter  Prior Authorization for Humira (CF) 40MG/0.4ML syringe kit has been approved.     Effective: 07-20-2022 to 07-21-2023

## 2022-07-20 NOTE — Telephone Encounter (Signed)
Patient Advocate Encounter   Received notification that prior authorization is required for Humira (CF) 40MG/0.4ML syringe kit  Submitted: 07-20-2022 Key  [id number]  Status is pending

## 2022-08-03 ENCOUNTER — Encounter: Payer: Self-pay | Admitting: Gastroenterology

## 2022-08-05 ENCOUNTER — Telehealth: Payer: Self-pay | Admitting: Pharmacy Technician

## 2022-08-05 ENCOUNTER — Other Ambulatory Visit (HOSPITAL_COMMUNITY): Payer: Self-pay

## 2022-08-05 NOTE — Telephone Encounter (Signed)
PA has been submitted and telephone encounter has been created.

## 2022-08-05 NOTE — Telephone Encounter (Signed)
Patient Advocate Encounter  Received notification that prior authorization for MOTEGRITY 2MG is required.   PA submitted on 10.4.23 Key [id number] Status is pending    Luciano Cutter, CPhT Patient Advocate Phone: [phone]

## 2022-08-06 NOTE — Telephone Encounter (Signed)
Patient Advocate Encounter  Prior Authorization for MOTEGRITY 2MG  has been approved.    Effective: 08-05-2022 to 08-06-2023

## 2022-08-07 ENCOUNTER — Other Ambulatory Visit (INDEPENDENT_AMBULATORY_CARE_PROVIDER_SITE_OTHER): Payer: BC Managed Care – PPO

## 2022-08-07 ENCOUNTER — Encounter: Payer: Self-pay | Admitting: Gastroenterology

## 2022-08-07 ENCOUNTER — Ambulatory Visit: Payer: BC Managed Care – PPO | Admitting: Gastroenterology

## 2022-08-07 VITALS — BP 112/72 | HR 79 | Ht 65.0 in | Wt 169.0 lb

## 2022-08-07 DIAGNOSIS — K50019 Crohn's disease of small intestine with unspecified complications: Secondary | ICD-10-CM

## 2022-08-07 DIAGNOSIS — K5902 Outlet dysfunction constipation: Secondary | ICD-10-CM

## 2022-08-07 DIAGNOSIS — K5904 Chronic idiopathic constipation: Secondary | ICD-10-CM

## 2022-08-07 DIAGNOSIS — K581 Irritable bowel syndrome with constipation: Secondary | ICD-10-CM

## 2022-08-07 DIAGNOSIS — E538 Deficiency of other specified B group vitamins: Secondary | ICD-10-CM

## 2022-08-07 DIAGNOSIS — K50919 Crohn's disease, unspecified, with unspecified complications: Secondary | ICD-10-CM | POA: Diagnosis not present

## 2022-08-07 LAB — C-REACTIVE PROTEIN: CRP: <1 mg/dL (ref 0.5–20.0)

## 2022-08-07 MED ORDER — NA SULFATE-K SULFATE-MG SULF 17.5-3.13-1.6 GM/177ML PO SOLN: 1.0000 | ORAL | 0 refills | Status: DC

## 2022-08-07 MED ORDER — MOTEGRITY 2 MG PO TABS: 2.0000 mg | ORAL_TABLET | Freq: Every day | ORAL | 2 refills | Status: DC

## 2022-08-07 NOTE — Progress Notes (Signed)
Lacey Hess    [id number]    [birth year]  Primary Care Physician:Tysinger, Camelia Eng, PA-C  Referring Physician: Carlena Hurl, PA-C [address] [place],  [place] [address]   Chief complaint:  Constipation, crohn's  HPI:  41 year old very pleasant female with Crohn's disease here for follow-up visit with complaints of constipation   She did not notice any improvement with Amitiza, she is requiring to take multiple doses of MiraLAX along with good  She is continuing to struggle with constipation, having intermittent episodes of diarrhea.  She does not feel she is evacuating completely.  She is experiencing generalized abdominal bloating, pain and cramping.  She is continuing to have intermittent episodes of nausea and vomiting   Small bowel video capsule Mar 23, 2022: Incomplete exam.  Negative for active inflammation or Crohn's disease   CT abdomen pelvis February 25, 2022: Negative for any acute intra-abdominal pathology.  No bowel obstruction or thickening to suggest acute Crohn's flare   Right upper quadrant ultrasound December 16, 2021 was negative for any acute pathology, no cholelithiasis or cholecystitis   She is no longer following with dietitian or physical therapy, according to [facility] both of them felt that they have done everything and did not have any other options available to improve her symptoms   Mother diagnosed with Crohn's disease in 2021 and and has ileostomy with high output.  She is worried if she ever has to undergo a colostomy or an ileostomy   GI history: On Humira biweekly injection.    Humira antibody level detected at 28 and drug level 16 on September 06, 2018.    Colonoscopy December 06, 2017 showed multiple aphthous ulcers in terminal ileum, biopsy consistent with erosions and active inflammation. CT enterography April 2019 with contrast showed mild wall thickening and abnormal mucosal enhancement in terminal ileum highly suspicious for  Crohn's disease.   Outpatient Encounter Medications as of 08/07/2022  Medication Sig   acetaminophen (TYLENOL) 500 MG tablet Take 500-1,000 mg by mouth every 8 (eight) hours as needed (for pain).    cyanocobalamin (,VITAMIN B-12,) 1000 MCG/ML injection 1 B12 injection every 7 days x 4 weeks then resume every 14 days   etonogestrel-ethinyl estradiol (NUVARING) 0.12-0.015 MG/24HR vaginal ring Place 1 each vaginally every 28 (twenty-eight) days. Insert vaginally and leave in place for 3 consecutive weeks, then remove for 1 week.   HUMIRA PEN 40 MG/0.4ML PNKT INJECT 1 PEN UNDER THE SKIN EVERY 14 DAYS.   lubiprostone (AMITIZA) 24 MCG capsule Take 1 capsule (24 mcg total) by mouth 2 (two) times daily with a meal.   Multiple Vitamin (MULTIVITAMIN) tablet Take 1 tablet by mouth daily.   Nitroglycerin 0.4 % OINT Apply pea-sized amount to the hemorrhoid twice daily as needed for pain until advised to discontinue.   nortriptyline (PAMELOR) 10 MG capsule TAKE 1 CAPSULE(10 MG) BY MOUTH AT BEDTIME   ondansetron (ZOFRAN ODT) 4 MG disintegrating tablet Take 1 tablet (4 mg total) by mouth every 8 (eight) hours as needed for nausea or vomiting.   polyethylene glycol (MIRALAX / GLYCOLAX) 17 g packet Take 34 g by mouth 2 (two) times daily.   pramoxine-hydrocortisone (PROCTOCREAM-HC) 1-1 % rectal cream Place 1 Application rectally 2 (two) times daily.   TUBERCULIN SYR 1CC/26GX3/8" (B-D TB SYRINGE 1CC/26GX3/8") 26G X 3/8" 1 ML MISC Use every 3 weeks to administer Vitamin B12 injection IM   No facility-administered encounter medications on file as of  08/07/2022.    Allergies as of 08/07/2022 - Review Complete 08/07/2022  Allergen Reaction Noted   Adhesive [tape] Rash 08/29/2018    Past Medical History:  Diagnosis Date   B12 deficiency 2019   Crohn's disease ([place]) 12/2017    Past Surgical History:  Procedure Laterality Date   BREAST LUMPECTOMY Left    age 56yo   COLONOSCOPY  12/2017   mult ulcers in  terminal ileum, othewise normal colon.  Dr. Harl Bowie    Family History  Problem Relation Age of Onset   Crohn's disease Mother    Hepatitis Father    Drug abuse Brother    Cancer Maternal Grandmother        lung   Cancer Paternal Grandmother        breast   Heart disease Paternal Grandmother        CHF   Diabetes Maternal Aunt    Heart disease Paternal Aunt        CHF   Colon cancer Neg Hx    Esophageal cancer Neg Hx    Rectal cancer Neg Hx    Stomach cancer Neg Hx     Social History   Socioeconomic History   Marital status: Married    Spouse name: Not on file   Number of children: 0   Years of education: Not on file   Highest education level: Not on file  Occupational History   Occupation: [employer]  Tobacco Use   Smoking status: Never   Smokeless tobacco: Never  Vaping Use   Vaping Use: Never used  Substance and Sexual Activity   Alcohol use: Yes    Comment: 2-3 glasses weekly    Drug use: No   Sexual activity: Not on file  Other Topics Concern   Not on file  Social History Narrative   Works at [employer], 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  Exercise - peloton, yoga, weights.  01/2020.     Social Determinants of Health   Financial Resource Strain: Not on file  Food Insecurity: No Food Insecurity (02/10/2021)   Hunger Vital Sign    Worried About Running Out of Food in the Last Year: Never true    Ran Out of Food in the Last Year: Never true  Transportation Needs: No Transportation Needs (02/10/2021)   PRAPARE - Hydrologist (Medical): No    Lack of Transportation (Non-Medical): No  Physical Activity: Not on file  Stress: Not on file  Social Connections: Not on file  Intimate Partner Violence: Not on file      Review of systems: All other review of systems negative except as mentioned in the HPI.   Physical Exam: There were no vitals filed for this visit. Body mass index is 28.12  kg/m. Gen:      No acute distress HEENT:  sclera anicteric Abd:      soft, non-tender; no palpable masses, no distension Ext:    No edema Neuro: alert and oriented x 3 Psych: normal mood and affect  Data Reviewed:  Reviewed labs, radiology imaging, old records and pertinent past GI work up   Assessment and Plan/Recommendations:  41 year old very pleasant female with history of Crohn's disease, predominantly involving terminal ileum with worsening constipation   Generalized abdominal discomfort, nausea, vomiting and irregular bowel habits CT abdomen pelvis negative for any acute pathology Humira drug level is within therapeutic range in January 2022 with undetectable antibodies, continue  current regimen   IBS: Use nortriptyline 10 mg daily at bedtime as a neuromodulator and symptom management for IBS   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 No improvement with Amitiza Switch to Motegrity 83m daily  Persistent symptoms of dyssynergic defecation and pelvic floor dysfunction: We will schedule anorectal manometry for further evaluation  Schedule for sits marker study, she is interested in pursuing smart pill to assist the whole gut motility, will schedule it once approved by insurance   B12 deficiency:  continue maintenance injection every 2 weeks   Return in 3 months  This visit required >40 minutes of patient care (this includes precharting, chart review, review of results, face-to-face time used for counseling as well as treatment plan and follow-up. The patient was provided an opportunity to ask questions and all were answered. The patient agreed with the plan and demonstrated an understanding of the instructions.  KDamaris Hippo, Hess    CC: Tysinger, DCamelia Eng PA-C

## 2022-08-07 NOTE — Patient Instructions (Addendum)
Stop Amitiza.   Start Motegrity 2 mg- Take 1 capsule by mouth once daily.   Your provider has requested that you go to the basement level for lab work before leaving today. Press "B" on the elevator. The lab is located at the first door on the left as you exit the elevator.  We have sent the following medications to your pharmacy for you to pick up at your convenience: Suprep and [place] have been scheduled to have an anorectal manometry at [facility] on 12/16/21 at 10:30am. Please arrive 30 minutes prior to your appointment time for registration (1st floor of the hospital-admissions).  Please make certain to use 1 Fleets enema 2 hours prior to coming for your appointment. You can purchase Fleets enemas from the laxative section at your drug store. You should not eat anything during the two hours prior to the procedure. You may take regular medications with small sips of water at least 2 hours prior to the study.  Anorectal manometry is a test performed to evaluate patients with constipation or fecal incontinence. This test measures the pressures of the anal sphincter muscles, the sensation in the rectum, and the neural reflexes that are needed for normal bowel movements.  THE PROCEDURE The test takes approximately 30 minutes to 1 hour. You will be asked to change into a hospital gown. A technician or nurse will explain the procedure to you, take a brief health history, and answer any questions you may have. The patient then lies on his or her left side. A small, flexible tube, about the size of a thermometer, with a balloon at the end is inserted into the rectum. The catheter is connected to a machine that measures the pressure. During the test, the small balloon attached to the catheter may be inflated in the rectum to assess the normal reflex pathways. The nurse or technician may also ask the person to squeeze, relax, and push at various times. The anal sphincter muscle pressures  are measured during each of these maneuvers. To squeeze, the patient tightens the sphincter muscles as if trying to prevent anything from coming out. To push or bear down, the patient strains down as if trying to have a bowel movement.  You will be contact by office at a later time to schedule Smart Pill.   Thank you for choosing me and [facility].  Dr. Silverio Decamp

## 2022-08-10 LAB — HEPATITIS B SURFACE ANTIGEN: Hepatitis B Surface Ag: NONREACTIVE

## 2022-08-11 LAB — QUANTIFERON-TB GOLD PLUS
Mitogen-NIL: >10 IU/mL
NIL: 0.05 IU/mL
QuantiFERON-TB Gold Plus: NEGATIVE
TB1-NIL: 0.02 IU/mL
TB2-NIL: 0.05 IU/mL

## 2022-08-14 ENCOUNTER — Other Ambulatory Visit: Payer: Self-pay | Admitting: Gastroenterology

## 2022-08-25 ENCOUNTER — Encounter: Payer: Self-pay | Admitting: Gastroenterology

## 2022-08-26 ENCOUNTER — Encounter: Payer: Self-pay | Admitting: Gastroenterology

## 2022-08-27 ENCOUNTER — Encounter: Payer: Self-pay | Admitting: Gastroenterology

## 2022-09-01 ENCOUNTER — Ambulatory Visit (AMBULATORY_SURGERY_CENTER): Payer: BC Managed Care – PPO | Admitting: Gastroenterology

## 2022-09-01 ENCOUNTER — Encounter: Payer: Self-pay | Admitting: Gastroenterology

## 2022-09-01 VITALS — BP 120/60 | HR 58 | Temp 97.3°F | Resp 15 | Ht 65.0 in | Wt 169.0 lb

## 2022-09-01 DIAGNOSIS — R1084 Generalized abdominal pain: Secondary | ICD-10-CM | POA: Diagnosis not present

## 2022-09-01 DIAGNOSIS — K5080 Crohn's disease of both small and large intestine without complications: Secondary | ICD-10-CM

## 2022-09-01 MED ORDER — SODIUM CHLORIDE 0.9 % IV SOLN: 500.0000 mL | INTRAVENOUS | Status: DC

## 2022-09-01 NOTE — Progress Notes (Signed)
Pt resting comfortably. VSS. Airway intact. SBAR complete to RN. All questions answered.

## 2022-09-01 NOTE — Progress Notes (Signed)
Pt's states no medical or surgical changes since previsit or office visit. 

## 2022-09-01 NOTE — Progress Notes (Signed)
Please refer to office visit note 08/07/22. No additional changes in H&P Patient is appropriate for planned procedure(s) and anesthesia in an ambulatory setting  K. Denzil Magnuson , MD [phone]

## 2022-09-01 NOTE — Patient Instructions (Addendum)
-   Resume previous diet. - Continue present medications. - Repeat colonoscopy in 5 years for surveillance.  There were no polyps seen today!  You will need another screening colonoscopy in 5 years, you will receive a letter at that time when you are due for the procedure.     Please call us at [phone] if you have a change in bowel habits, change in family history of colo-rectal cancer, rectal bleeding or other GI concern before that time.    YOU HAD AN ENDOSCOPIC PROCEDURE TODAY AT [facility]:   Refer to the procedure report that was given to you for any specific questions about what was found during the examination.  If the procedure report does not answer your questions, please call your gastroenterologist to clarify.  If you requested that your care partner not be given the details of your procedure findings, then the procedure report has been included in a sealed envelope for you to review at your convenience later.  YOU SHOULD EXPECT: Some feelings of bloating in the abdomen. Passage of more gas than usual.  Walking can help get rid of the air that was put into your GI tract during the procedure and reduce the bloating. If you had a lower endoscopy (such as a colonoscopy or flexible sigmoidoscopy) you may notice spotting of blood in your stool or on the toilet paper. If you underwent a bowel prep for your procedure, you may not have a normal bowel movement for a few days.  Please Note:  You might notice some irritation and congestion in your nose or some drainage.  This is from the oxygen used during your procedure.  There is no need for concern and it should clear up in a day or so.  SYMPTOMS TO REPORT IMMEDIATELY:  Following lower endoscopy (colonoscopy or flexible sigmoidoscopy):  Excessive amounts of blood in the stool  Significant tenderness or worsening of abdominal pains  Swelling of the abdomen that is new, acute  Fever of 100F or higher  For urgent or  emergent issues, a gastroenterologist can be reached at any hour by calling [phone]. Do not use MyChart messaging for urgent concerns.    DIET:  We do recommend a small meal at first, but then you may proceed to your regular diet.  Drink plenty of fluids but you should avoid alcoholic beverages for 24 hours.  ACTIVITY:  You should plan to take it easy for the rest of today and you should NOT DRIVE or use heavy machinery until tomorrow (because of the sedation medicines used during the test).    FOLLOW UP: Our staff will call the number listed on your records the next business day following your procedure.  We will call around 7:15- 8:00 am to check on you and address any questions or concerns that you may have regarding the information given to you following your procedure. If we do not reach you, we will leave a message.      SIGNATURES/CONFIDENTIALITY: You and/or your care partner have signed paperwork which will be entered into your electronic medical record.  These signatures attest to the fact that that the information above on your After Visit Summary has been reviewed and is understood.  Full responsibility of the confidentiality of this discharge information lies with you and/or your care-partner.

## 2022-09-01 NOTE — Op Note (Signed)
[facility] Patient Name: Lacey Hess Procedure Date: 09/01/2022 9:33 AM MRN: [id number] Endoscopist: Mauri Pole , MD, [id number] Age: 41 Referring MD:  Date of Birth: 1981-05-28 Gender: Female Account #: [id number] Procedure:                Colonoscopy Indications:              Generalized abdominal pain, Disease activity                            assessment of Crohn's disease of the small bowel                            and colon, Assess therapeutic response to therapy                            of Crohn's disease of the small bowel and colon Medicines:                Monitored Anesthesia Care Procedure:                Pre-Anesthesia Assessment:                           - Prior to the procedure, a History and Physical                            was performed, and patient medications and                            allergies were reviewed. The patient's tolerance of                            previous anesthesia was also reviewed. The risks                            and benefits of the procedure and the sedation                            options and risks were discussed with the patient.                            All questions were answered, and informed consent                            was obtained. Prior Anticoagulants: The patient has                            taken no anticoagulant or antiplatelet agents. ASA                            Grade Assessment: II - A patient with mild systemic                            disease. After reviewing the risks and benefits,  the patient was deemed in satisfactory condition to                            undergo the procedure.                           After obtaining informed consent, the colonoscope                            was passed under direct vision. Throughout the                            procedure, the patient's blood pressure, pulse, and                            oxygen  saturations were monitored continuously. The                            Olympus PCF-H190DL ([id number]) Colonoscope was                            introduced through the anus and advanced to the the                            terminal ileum, with identification of the                            appendiceal orifice and IC valve. The colonoscopy                            was performed without difficulty. The patient                            tolerated the procedure well. The quality of the                            bowel preparation was excellent. The terminal                            ileum, ileocecal valve, appendiceal orifice, and                            rectum were photographed. Scope In: 9:37:11 AM Scope Out: 9:55:22 AM Scope Withdrawal Time: 0 hours 8 minutes 24 seconds  Total Procedure Duration: 0 hours 18 minutes 11 seconds  Findings:                 The perianal and digital rectal examinations were                            normal.                           The terminal ileum appeared normal.  Non-bleeding external and internal hemorrhoids were                            found during retroflexion. The hemorrhoids were                            medium-sized.                           The exam was otherwise without abnormality.                           The Simple Endoscopic Score for Crohn's Disease was                            determined based on the endoscopic appearance of                            the mucosa in the following segments:                           - Ileum: Findings include no ulcers present, no                            ulcerated surfaces, no affected surfaces, no                            narrowings and no ulcers present, no ulcerated                            surfaces, no affected surfaces and no narrowings.                            Segment score: 0.                           - Right Colon: Findings include no ulcers  present,                            no ulcerated surfaces, no affected surfaces, no                            narrowings and no ulcers present, no ulcerated                            surfaces, no affected surfaces and no narrowings.                            Segment score: 0.                           - Transverse Colon: Findings include no ulcers                            present, no ulcerated surfaces, no affected  surfaces, no narrowings and no ulcers present, no                            ulcerated surfaces, no affected surfaces and no                            narrowings. Segment score: 0.                           - Left Colon: Findings include no ulcers present,                            no ulcerated surfaces, no affected surfaces, no                            narrowings and no ulcers present, no ulcerated                            surfaces, no affected surfaces and no narrowings.                            Segment score: 0.                           - Rectum: Findings include no ulcers present, no                            ulcerated surfaces, no affected surfaces, no                            narrowings and no ulcers present, no ulcerated                            surfaces, no affected surfaces and no narrowings.                            Segment score: 0.                           - Total SES-CD aggregate score: 0. Complications:            No immediate complications. Estimated Blood Loss:     Estimated blood loss was minimal. Impression:               - The examined portion of the ileum was normal.                           - Non-bleeding external and internal hemorrhoids.                           - The examination was otherwise normal.                           - Simple Endoscopic Score for Crohn's Disease: 0,  mucosal inflammatory changes secondary to Crohn's                            disease, in remission.                            - No specimens collected. Recommendation:           - Patient has a contact number available for                            emergencies. The signs and symptoms of potential                            delayed complications were discussed with the                            patient. Return to normal activities tomorrow.                            Written discharge instructions were provided to the                            patient.                           - Resume previous diet.                           - Continue present medications.                           - Repeat colonoscopy in 5 years for surveillance. Mauri Pole, MD 09/01/2022 9:59:30 AM This report has been signed electronically.

## 2022-09-02 ENCOUNTER — Telehealth: Payer: Self-pay

## 2022-09-02 NOTE — Telephone Encounter (Signed)
  Follow up Call-     09/01/2022    8:13 AM  Call back number  Post procedure Call Back phone  # [phone]  Permission to leave phone message Yes     Patient questions:  Do you have a fever, pain , or abdominal swelling? No. Pain Score  0 *  Have you tolerated food without any problems? Yes.    Have you been able to return to your normal activities? Yes.    Do you have any questions about your discharge instructions: Diet   No. Medications  No. Follow up visit  No.  Do you have questions or concerns about your Care? No.  Actions: * If pain score is 4 or above: No action needed, pain <4.

## 2022-09-21 ENCOUNTER — Encounter: Payer: Self-pay | Admitting: Gastroenterology

## 2022-09-21 ENCOUNTER — Telehealth: Payer: Self-pay | Admitting: *Deleted

## 2022-09-21 MED ORDER — ETONOGESTREL-ETHINYL ESTRADIOL 0.12-0.015 MG/24HR VA RING: 1.0000 | VAGINAL_RING | VAGINAL | 0 refills | Status: DC

## 2022-09-21 NOTE — Telephone Encounter (Signed)
Pt left message requesting Nuvaring refill. Per chart review, pt had last Annual Gyn exam on 02/10/21. Rx refill for 3 month supply sent and pt was notified of need for Annual Gyn appt.soon. she will be notified of appt scheduled via Mychart.  She voiced understanding and agreed to plan of care.

## 2022-11-05 ENCOUNTER — Encounter: Payer: Self-pay | Admitting: Adult Health

## 2022-11-05 ENCOUNTER — Ambulatory Visit (INDEPENDENT_AMBULATORY_CARE_PROVIDER_SITE_OTHER): Payer: Self-pay | Admitting: Adult Health

## 2022-11-05 VITALS — BP 116/62 | HR 63 | Temp 97.4°F | Wt 165.6 lb

## 2022-11-05 DIAGNOSIS — B349 Viral infection, unspecified: Secondary | ICD-10-CM

## 2022-11-05 NOTE — Progress Notes (Signed)
[facility] [address], [place] [address]   Office Visit Note  Patient Name: Lacey Hess Date of Birth [id number]  Medical Record number [id number]  Date of Service: 11/05/2022  Chief Complaint  Patient presents with   Sore Throat    Started Tue afternoon with throat. Nose is starting to run. Took COVID test last night that was negative. Headache and cough. No fever as of last night.      Sore Throat  Associated symptoms include coughing and headaches. Pertinent negatives include no diarrhea, ear pain or vomiting.   Pt is here for a sick visit. Two days ago she started with sore throat.  It has become worse and now has cough, headache, and runny nose.   Denies fever, chills, ear pain or sinus pressure.  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  She tested negative for covid at home last night.     Current Medication:  Outpatient Encounter Medications as of 11/05/2022  Medication Sig   acetaminophen (TYLENOL) 500 MG tablet Take 500-1,000 mg by mouth every 8 (eight) hours as needed (for pain).    cyanocobalamin (,VITAMIN B-12,) 1000 MCG/ML injection 1 B12 injection every 7 days x 4 weeks then resume every 14 days   escitalopram (LEXAPRO) 10 MG tablet Take 10 mg by mouth daily.   etonogestrel-ethinyl estradiol (NUVARING) 0.12-0.015 MG/24HR vaginal ring Place 1 each vaginally every 28 (twenty-eight) days. Insert vaginally and leave in place for 3 consecutive weeks, then remove for 1 week.   HUMIRA PEN 40 MG/0.4ML PNKT INJECT 1 PEN UNDER THE SKIN EVERY 14 DAYS.   modafinil (PROVIGIL) 100 MG tablet Take 50 mg by mouth every morning.   Multiple Vitamin (MULTIVITAMIN) tablet Take 1 tablet by mouth daily.   TUBERCULIN SYR 1CC/26GX3/8" (B-D TB SYRINGE 1CC/26GX3/8") 26G X 3/8" 1 ML MISC Use every 3 weeks to administer Vitamin B12 injection IM   busPIRone (BUSPAR) 7.5 MG tablet Take by mouth. (Patient not taking: Reported on 09/01/2022)   Nitroglycerin  0.4 % OINT Apply pea-sized amount to the hemorrhoid twice daily as needed for pain until advised to discontinue. (Patient not taking: Reported on 08/07/2022)   ondansetron (ZOFRAN ODT) 4 MG disintegrating tablet Take 1 tablet (4 mg total) by mouth every 8 (eight) hours as needed for nausea or vomiting. (Patient not taking: Reported on 11/05/2022)   polyethylene glycol (MIRALAX / GLYCOLAX) 17 g packet Take 34 g by mouth 2 (two) times daily. (Patient not taking: Reported on 11/05/2022)   pramoxine-hydrocortisone (PROCTOCREAM-HC) 1-1 % rectal cream Place 1 Application rectally 2 (two) times daily. (Patient not taking: Reported on 09/01/2022)   Prucalopride Succinate (MOTEGRITY) 2 MG TABS Take 1 tablet (2 mg total) by mouth daily. (Patient not taking: Reported on 11/05/2022)   No facility-administered encounter medications on file as of 11/05/2022.      Medical History: Past Medical History:  Diagnosis Date   Anxiety    B12 deficiency 2019   Crohn's disease ([facility]) 12/2017     Vital Signs: BP 116/62 (BP Location: Left Arm, Patient Position: Sitting, Cuff Size: Normal)   Pulse 63   Temp (!) 97.4 F (36.3 C) (Tympanic)   Wt 165 lb 9.6 oz (75.1 kg)   SpO2 99%   BMI 27.56 kg/m    Review of Systems  Constitutional:  Negative for chills, fatigue and fever.  HENT:  Positive for rhinorrhea, sinus pressure and sore throat. Negative for ear pain.   Eyes:  Negative for pain and itching.  Respiratory:  Positive for cough.   Cardiovascular:  Negative for chest pain.  Gastrointestinal:  Negative for diarrhea, nausea and vomiting.  Neurological:  Positive for headaches.    Physical Exam Vitals reviewed.  Constitutional:      Appearance: She is well-developed.  HENT:     Head: Normocephalic.     Right Ear: Tympanic membrane and ear canal normal.     Left Ear: Tympanic membrane and ear canal normal.     Mouth/Throat:     Mouth: Mucous membranes are moist.     Tonsils: 0 on the right. 0 on the  left.  Cardiovascular:     Rate and Rhythm: Normal rate.  Pulmonary:     Effort: Pulmonary effort is normal.  Neurological:     Mental Status: She is alert.     Assessment/Plan: 1. Viral illness Rest, and drink plenty of water.  Use cough drops, gargle warm sal water or drink wam liquids (like tea with honey) as needed for cough/throat irritation.   Take over-the-counter medicines (such as Dayquil or Nyquil) as discussed at your visit to help manage your symptoms.  Send a MyChart message to the provider or schedule a return appointment as needed for new/worsening symptoms (especially shortness of breath or chest pain) or if symptoms not improving with recommended treatment over the next 5-7 days.   Retest for covid in 24-48 hours.        General Counseling: Mulan verbalizes understanding of the findings of todays visit and agrees with plan of treatment. I have discussed any further diagnostic evaluation that may be needed or ordered today. We also reviewed her medications today. she has been encouraged to call the office with any questions or concerns that should arise related to todays visit.   No orders of the defined types were placed in this encounter.   No orders of the defined types were placed in this encounter.   Time spent:25 Minutes    Kendell Bane AGNP-C Nurse Practitioner

## 2022-11-10 ENCOUNTER — Ambulatory Visit (INDEPENDENT_AMBULATORY_CARE_PROVIDER_SITE_OTHER): Payer: Self-pay | Admitting: Physician Assistant

## 2022-11-10 DIAGNOSIS — U071 COVID-19: Secondary | ICD-10-CM

## 2022-11-10 NOTE — Progress Notes (Signed)
Virtual Visit Consent   Lacey Hess, you are scheduled for a virtual visit with a  provider today. Just as with appointments in the office, your consent must be obtained to participate. Your consent will be active for this visit and any virtual visit you may have with one of our providers in the next 365 days. If you have a MyChart account, a copy of this consent can be sent to you electronically.  As this is a virtual telephone visit which doesn't allow your provider to perform a traditional examination and may limit your provider's ability to fully assess your condition. If your provider identifies any concerns that need to be evaluated in person or the need to arrange testing (such as labs, EKG, etc.), we will make arrangements to do so. Although advances in technology are sophisticated, we cannot ensure that it will always work on either your end or our end. If the connection with a video visit is poor, the visit may have to be switched to a telephone visit. With either a video or telephone visit, we are not always able to ensure that we have a secure connection.  By engaging in this virtual visit, you consent to the provision of healthcare and authorize for your insurance to be billed (if applicable) for the services provided during this visit. Depending on your insurance coverage, you may receive a charge related to this service.  I need to obtain your verbal consent now. Are you willing to proceed with your visit today? Srihitha Tagliaferri has provided verbal consent on 11/10/2022 for a virtual visit (video or telephone). Gilberto Better, [place]  Date: 11/10/2022 10:10 AM  Virtual Visit via Telephone Note   I, Gilberto Better, connected with  Paisley Grajeda  ([id number], 04-Aug-1981) on 11/10/22 at 10:00 AM EST by a telephone and verified that I am speaking with the correct person using two identifiers.  Location: Patient: Virtual Visit Location Patient: Home Provider: Virtual Visit Location  Provider: Office/Clinic   I discussed the limitations of evaluation and management by telemedicine and the availability of in person appointments. The patient expressed understanding and agreed to proceed.    History of Present Illness: Lacey Hess is a 42 y.o. who identifies as a female who was assigned female at birth, and is being seen today for covid 19 infection.  HPI: 42 y/o F presents via telephone for testing positive for covid. Her symptoms of sore throat started one week ago. She tested positive for covid on 3 days later. Her isolation ended last Sunday. She is doing well. NO clinical concerns at the present time.     Problems:  Patient Active Problem List   Diagnosis Date Noted   Crohn's disease with complication (HCC) 01/11/2020   Vaccine counseling 01/11/2020   High risk medication use 01/11/2020   Family history of breast cancer 01/05/2020   Women's annual routine gynecological examination 01/05/2020   Crohn's disease involving terminal ileum (HCC) 05/25/2018   Other constipation 12/03/2017    Allergies:  Allergies  Allergen Reactions   Adhesive [Tape] Rash   Medications:  Current Outpatient Medications:    acetaminophen (TYLENOL) 500 MG tablet, Take 500-1,000 mg by mouth every 8 (eight) hours as needed (for pain). , Disp: , Rfl:    busPIRone (BUSPAR) 7.5 MG tablet, Take by mouth. (Patient not taking: Reported on 09/01/2022), Disp: , Rfl:    cyanocobalamin (,VITAMIN B-12,) 1000 MCG/ML injection, 1 B12 injection every 7 days x 4 weeks then resume every 14 days,  Disp: 4 mL, Rfl: 11   escitalopram (LEXAPRO) 10 MG tablet, Take 10 mg by mouth daily., Disp: , Rfl:    etonogestrel-ethinyl estradiol (NUVARING) 0.12-0.015 MG/24HR vaginal ring, Place 1 each vaginally every 28 (twenty-eight) days. Insert vaginally and leave in place for 3 consecutive weeks, then remove for 1 week., Disp: 3 each, Rfl: 0   HUMIRA PEN 40 MG/0.4ML PNKT, INJECT 1 PEN UNDER THE SKIN EVERY 14 DAYS.,  Disp: 2 each, Rfl: 6   modafinil (PROVIGIL) 100 MG tablet, Take 50 mg by mouth every morning., Disp: , Rfl:    Multiple Vitamin (MULTIVITAMIN) tablet, Take 1 tablet by mouth daily., Disp: , Rfl:    Nitroglycerin 0.4 % OINT, Apply pea-sized amount to the hemorrhoid twice daily as needed for pain until advised to discontinue. (Patient not taking: Reported on 08/07/2022), Disp: 30 g, Rfl: 1   ondansetron (ZOFRAN ODT) 4 MG disintegrating tablet, Take 1 tablet (4 mg total) by mouth every 8 (eight) hours as needed for nausea or vomiting. (Patient not taking: Reported on 11/05/2022), Disp: 20 tablet, Rfl: 0   polyethylene glycol (MIRALAX / GLYCOLAX) 17 g packet, Take 34 g by mouth 2 (two) times daily. (Patient not taking: Reported on 11/05/2022), Disp: , Rfl:    pramoxine-hydrocortisone (PROCTOCREAM-HC) 1-1 % rectal cream, Place 1 Application rectally 2 (two) times daily. (Patient not taking: Reported on 09/01/2022), Disp: 30 g, Rfl: 1   Prucalopride Succinate (MOTEGRITY) 2 MG TABS, Take 1 tablet (2 mg total) by mouth daily. (Patient not taking: Reported on 11/05/2022), Disp: 30 tablet, Rfl: 2   TUBERCULIN SYR 1CC/26GX3/8" (B-D TB SYRINGE 1CC/26GX3/8") 26G X 3/8" 1 ML MISC, Use every 3 weeks to administer Vitamin B12 injection IM, Disp: 25 each, Rfl: 3  Observations/Objective: Patient is well-developed, well-nourished in no acute distress.  Resting comfortably  at home.  Head is normocephalic, atraumatic.  No labored breathing.  Speech is clear and coherent with logical content.  Patient is alert and oriented at baseline.    Assessment and Plan: 1. COVID-19 virus infection   Reviewed CDC guidelines with patient. Pt will be isolated for 5 days from the start of her symptoms. Needs to be fever free for 24 hours without the aid of medicine before returning back to work. She must wear a mask for an additional 5 days.    Follow Up Instructions: I discussed the assessment and treatment plan with the patient.  The patient was provided an opportunity to ask questions and all were answered. The patient agreed with the plan and demonstrated an understanding of the instructions.  A copy of instructions were sent to the patient via MyChart unless otherwise noted below.    The patient was advised to call back or seek an in-person evaluation if the symptoms worsen or if the condition fails to improve as anticipated.  Time:  I spent 10 minutes with the patient via telehealth technology discussing the above problems/concerns.    Johnna Acosta, PA-C

## 2022-11-16 ENCOUNTER — Telehealth: Payer: Self-pay | Admitting: *Deleted

## 2022-11-16 NOTE — Telephone Encounter (Signed)
Pt left VM message stating that is recovering from Covid and wants to know if she can keep appt as scheduled on 1/17.  Her +Covid test was on 1/3 and she only had cold symptoms. I returned call to pt and she further stated that she has no symptoms of illness now. She never did have fever. I advised she may keep appt on 1/17. She voiced understanding.

## 2022-11-17 NOTE — Progress Notes (Signed)
ANNUAL EXAM Patient name: Lacey Hess MRN [id number]  Date of birth: [birth year] Chief Complaint:   Gynecologic Exam  History of Present Illness:   Lacey Hess is a 42 y.o. nulliparous Caucasian female being seen today for a routine annual exam.  Current complaints: No gynecological concerns, considering pregnancy but given her autoimmune status is heavily considering her wife being their gestational carrier.  Patient's last menstrual period was 11/17/2022 (exact date).  The pregnancy intention screening data noted above was reviewed. Potential methods of contraception were discussed. The patient elected to proceed with No data recorded.   Last pap 01/04/2020. Results were: NILM w/ HRHPV negative. H/O abnormal pap: no Last mammogram: Never (age). Results were: N/A. Family h/o breast cancer: no Last colonoscopy: 09/01/22. Results were: normal. Family h/o colorectal cancer: no     11/18/2022    8:26 AM 02/10/2021    4:50 PM 01/11/2020    8:41 AM 01/04/2020    4:17 PM 09/09/2018   11:27 AM  Depression screen PHQ 2/9  Decreased Interest 0 0 0 0 0  Down, Depressed, Hopeless 0 0 0 0 0  PHQ - 2 Score 0 0 0 0 0  Altered sleeping 0 0 0 0   Tired, decreased energy 2 2 0 1   Change in appetite 0 0 0 0   Feeling bad or failure about yourself  0 0 0 0   Trouble concentrating 0 0 0 0   Moving slowly or fidgety/restless 0 0 0 0   Suicidal thoughts 0 0 0 0   PHQ-9 Score 2 2 0 1       11/18/2022    8:26 AM 02/10/2021    4:50 PM 01/04/2020    4:17 PM  GAD 7 : Generalized Anxiety Score  Nervous, Anxious, on Edge 0 1 0  Control/stop worrying 1 0 0  Worry too much - different things 1 1 1   Trouble relaxing 1 1 0  Restless 1 1 1   Easily annoyed or irritable 0 0 0  Afraid - awful might happen 0 0 0  Total GAD 7 Score 4 4 2    Review of Systems:   Pertinent items are noted in HPI Denies any headaches, blurred vision, fatigue, shortness of breath, chest pain, abdominal pain, abnormal vaginal  discharge/itching/odor/irritation, problems with periods, bowel movements, urination, or intercourse unless otherwise stated above.  Pertinent History Reviewed:  Reviewed past medical,surgical, social and family history.  Reviewed problem list, medications and allergies. Physical Assessment:   Vitals:   11/18/22 0824  BP: 110/71  Pulse: 69  Weight: 164 lb (74.4 kg)  Height: 5\' 5"  (1.651 m)   Body mass index is 27.29 kg/m.   Physical Examination:  General appearance - well appearing, and in no distress Mental status - alert, oriented to person, place, and time Psych:  She has a normal mood and affect Skin - warm and dry, normal color, no suspicious lesions noted Chest - effort normal, no problems with respiration noted Heart - normal rate and regular rhythm Neck:  midline trachea, no thyromegaly or nodules Abdomen - soft, nontender, nondistended, no masses or organomegaly Pelvic - VULVA: normal appearing vulva with no masses, tenderness or lesions   VAGINA: normal appearing vagina with normal color and discharge, no lesions   CERVIX: normal appearing cervix without discharge or lesions, no CMT. Thin prep pap is done with HR HPV cotesting Extremities:  No swelling or varicosities noted  Chaperone present for exam  No results  found for this or any previous visit (from the past 24 hour(s)).  Assessment & Plan:  1. Women's annual routine gynecological examination - Will follow up results of pap smear and manage accordingly. - Considering pregnancy - reviewed how pregnancy may affect management of her Crohns - Suggested inducing lactation if she decides not to be the gestational carrier but still desires to be closely involved - Mammogram ordered for 81mo - Colon cancer screening is up to date and patient is under care of gastroenterology for Crohn's disease - Routine preventative health maintenance measures emphasized.  2. Uses vaginal ring for contraception - Last refilled Nov  2023, uses for relief of dysmenorrhea  Mammogram: @ 40yo, or sooner if problems Colonoscopy: per GI, or sooner if problems  No orders of the defined types were placed in this encounter.  Meds: No orders of the defined types were placed in this encounter.  Follow-up: Return in about 1 year (around 11/19/2023) for IN-PERSON, ANN.  Gabriel Carina, CNM 11/18/2022 9:05 AM

## 2022-11-18 ENCOUNTER — Ambulatory Visit (INDEPENDENT_AMBULATORY_CARE_PROVIDER_SITE_OTHER): Payer: BC Managed Care – PPO | Admitting: Certified Nurse Midwife

## 2022-11-18 ENCOUNTER — Encounter: Payer: Self-pay | Admitting: Certified Nurse Midwife

## 2022-11-18 ENCOUNTER — Other Ambulatory Visit (HOSPITAL_COMMUNITY)
Admission: RE | Admit: 2022-11-18 | Discharge: 2022-11-18 | Disposition: A | Discharge status: Home / Self Care | Payer: BC Managed Care – PPO | Source: Ambulatory Visit | Attending: Certified Nurse Midwife | Admitting: Certified Nurse Midwife

## 2022-11-18 VITALS — BP 110/71 | HR 69 | Ht 65.0 in | Wt 164.0 lb

## 2022-11-18 DIAGNOSIS — Z01419 Encounter for gynecological examination (general) (routine) without abnormal findings: Secondary | ICD-10-CM

## 2022-11-18 DIAGNOSIS — Z124 Encounter for screening for malignant neoplasm of cervix: Secondary | ICD-10-CM

## 2022-11-18 DIAGNOSIS — Z789 Other specified health status: Secondary | ICD-10-CM

## 2022-11-20 ENCOUNTER — Other Ambulatory Visit: Payer: Self-pay | Admitting: Gastroenterology

## 2022-11-20 LAB — CYTOLOGY - PAP
Adequacy: ABSENT
Comment: NEGATIVE
Diagnosis: NEGATIVE
High risk HPV: NEGATIVE

## 2022-11-24 ENCOUNTER — Other Ambulatory Visit (HOSPITAL_COMMUNITY): Payer: Self-pay

## 2022-11-24 ENCOUNTER — Encounter: Payer: Self-pay | Admitting: Gastroenterology

## 2022-11-25 ENCOUNTER — Other Ambulatory Visit: Payer: Self-pay

## 2022-11-25 MED ORDER — "BD TB SYRINGE 26G X 3/8"" 1 ML MISC": 3 refills | Status: DC

## 2022-11-26 ENCOUNTER — Other Ambulatory Visit (HOSPITAL_COMMUNITY): Payer: Self-pay

## 2022-11-26 ENCOUNTER — Telehealth: Payer: Self-pay

## 2022-11-26 ENCOUNTER — Telehealth: Payer: Self-pay | Admitting: Pharmacy Technician

## 2022-11-26 NOTE — Telephone Encounter (Signed)
Patient reports the [employer] needs a PA per pharmacist at [facility]. Thank you for your help with this.

## 2022-11-26 NOTE — Telephone Encounter (Signed)
PA has been submitted, and telephone encounter has been created.  Pt insurance change which required a new PA. The questioned was asked if pt had a trial of Trulance. This was not asked with the other insurance. Will follow up on other encounter with outcome.

## 2022-11-26 NOTE — Telephone Encounter (Signed)
Patient Advocate Encounter  Received notification from [facility] that prior authorization for MOTEGRITY 2MG  is required.   PA submitted on 1.25.24 Key [id number] Status is pending

## 2022-12-02 ENCOUNTER — Telehealth: Payer: Self-pay | Admitting: Gastroenterology

## 2022-12-02 NOTE — Telephone Encounter (Signed)
The patient is inquiring . Do we know anything yet?

## 2022-12-02 NOTE — Telephone Encounter (Signed)
Pharmacy called and stated that they cannot fill th Humira RX because the insurance is out of network. It can only be filled with [facility]. [phone] ext. [phone]

## 2022-12-02 NOTE — Telephone Encounter (Signed)
Entered [facility] pharmacy but not sure its the correct one will call the number provided

## 2022-12-03 MED ORDER — HUMIRA (2 PEN) 40 MG/0.4ML ~~LOC~~ AJKT: AUTO-INJECTOR | SUBCUTANEOUS | 6 refills | Status: DC

## 2022-12-03 NOTE — Telephone Encounter (Signed)
Sent rx Humira to [facility] today

## 2022-12-10 ENCOUNTER — Telehealth: Payer: Self-pay | Admitting: Pharmacy Technician

## 2022-12-10 ENCOUNTER — Other Ambulatory Visit (HOSPITAL_COMMUNITY): Payer: Self-pay

## 2022-12-10 NOTE — Telephone Encounter (Signed)
Received a fax regarding Prior Authorization from [facility] for [place] 2MG . Authorization has been DENIED because pt must have a trial and fail of TRULANCE.  Phone# [phone]

## 2022-12-10 NOTE — Telephone Encounter (Signed)
Do you want her to try Trulance?

## 2022-12-10 NOTE — Telephone Encounter (Signed)
PA for Humira has been submitted as EXPEDITED. Motegrity was denied because pt must have a trial of Trulance. Pt insurance changed since [place] PA was last approved. Insurance has different restrictions. Letter for denial has been scanned in to pt chart.

## 2022-12-10 NOTE — Telephone Encounter (Signed)
Patient Advocate Encounter  Received notification from [facility] that prior authorization for HUMIRA 40MG  is required.   PA submitted on 2.8.24 Key [id number] Status is pending

## 2022-12-11 NOTE — Telephone Encounter (Signed)
Agree with trial of Trulance as required per insurance, if she fails it, will plan to appeal Motegrity again. Thanks

## 2022-12-14 ENCOUNTER — Other Ambulatory Visit: Payer: Self-pay | Admitting: *Deleted

## 2022-12-14 ENCOUNTER — Other Ambulatory Visit: Payer: Self-pay

## 2022-12-14 ENCOUNTER — Other Ambulatory Visit (HOSPITAL_COMMUNITY): Payer: Self-pay

## 2022-12-14 DIAGNOSIS — Z30019 Encounter for initial prescription of contraceptives, unspecified: Secondary | ICD-10-CM

## 2022-12-14 MED ORDER — TRULANCE 3 MG PO TABS: 1.0000 | ORAL_TABLET | Freq: Every day | ORAL | 6 refills | Status: DC

## 2022-12-14 MED ORDER — ETONOGESTREL-ETHINYL ESTRADIOL 0.12-0.015 MG/24HR VA RING: 1.0000 | VAGINAL_RING | VAGINAL | 0 refills | Status: DC

## 2022-12-14 NOTE — Telephone Encounter (Signed)
From: Sebastian Ache To: Office of Noni Saupe, NP Sent: 12/13/2022 6:44 PM EST Subject: Medication Renewal Request  Refills have been requested for the following medications:   etonogestrel-ethinyl estradiol (NUVARING) 0.12-0.015 MG/24HR vaginal ring [Jennifer Rasch]  Preferred pharmacy: [facility] [place], [place] Delivery method: [employer]

## 2022-12-14 NOTE — Telephone Encounter (Signed)
Patient Advocate Encounter  Prior Authorization for Lacey Hess has been approved.    PA# -- Effective dates: 2.8.24 through 2.6.25

## 2022-12-16 ENCOUNTER — Encounter (HOSPITAL_COMMUNITY)
Admission: RE | Disposition: A | Discharge status: Home / Self Care | Payer: Self-pay | Source: Home / Self Care | Attending: Gastroenterology

## 2022-12-16 ENCOUNTER — Ambulatory Visit (HOSPITAL_COMMUNITY)
Admission: RE | Admit: 2022-12-16 | Discharge: 2022-12-16 | Disposition: A | Discharge status: Home / Self Care | Payer: BC Managed Care – PPO | Attending: Gastroenterology | Admitting: Gastroenterology

## 2022-12-16 DIAGNOSIS — K5090 Crohn's disease, unspecified, without complications: Secondary | ICD-10-CM | POA: Insufficient documentation

## 2022-12-16 DIAGNOSIS — K5902 Outlet dysfunction constipation: Secondary | ICD-10-CM | POA: Diagnosis not present

## 2022-12-16 DIAGNOSIS — K5909 Other constipation: Secondary | ICD-10-CM | POA: Diagnosis present

## 2022-12-16 HISTORY — PX: ANAL RECTAL MANOMETRY: SHX6358

## 2022-12-16 SURGERY — MANOMETRY, ANORECTAL
Anesthesia: Monitor Anesthesia Care

## 2022-12-16 NOTE — Progress Notes (Signed)
Anal rectal manometry performed per protocol without complications.  Patient tolerated well.  Balloon expulsion test performed per protocol without complications.  Patient tolerated well.  Unable to expel balloon after  3 minutes.

## 2022-12-19 ENCOUNTER — Encounter (HOSPITAL_COMMUNITY): Payer: Self-pay | Admitting: Gastroenterology

## 2022-12-22 NOTE — Telephone Encounter (Signed)
Patient has reported the Trulance will also require a prior authorization.  Thanks

## 2022-12-30 DIAGNOSIS — K5902 Outlet dysfunction constipation: Secondary | ICD-10-CM

## 2023-01-05 ENCOUNTER — Other Ambulatory Visit: Payer: Self-pay | Admitting: Gastroenterology

## 2023-01-08 ENCOUNTER — Telehealth: Payer: Self-pay

## 2023-01-08 ENCOUNTER — Other Ambulatory Visit (HOSPITAL_COMMUNITY): Payer: Self-pay

## 2023-01-08 ENCOUNTER — Other Ambulatory Visit: Payer: Self-pay | Admitting: Certified Nurse Midwife

## 2023-01-08 DIAGNOSIS — Z30019 Encounter for initial prescription of contraceptives, unspecified: Secondary | ICD-10-CM

## 2023-01-08 NOTE — Telephone Encounter (Signed)
PA request received via provider for Trulance '3MG'$  tablets  PA has been submitted via CMM to [facility] and is pending determination.   Key: [id number]

## 2023-01-08 NOTE — Telephone Encounter (Signed)
PA has been APPROVED from 01/08/2023-01/08/2024

## 2023-01-08 NOTE — Telephone Encounter (Signed)
PA has been submitted for Trulance and is pending determination, will be updated in additional encounter created.

## 2023-01-11 ENCOUNTER — Other Ambulatory Visit: Payer: Self-pay

## 2023-01-11 ENCOUNTER — Encounter: Payer: Self-pay | Admitting: Gastroenterology

## 2023-01-11 MED ORDER — CYANOCOBALAMIN 1000 MCG/ML IJ SOLN: 1000.0000 ug | INTRAMUSCULAR | 11 refills | Status: DC

## 2023-01-11 MED ORDER — "BD TB SYRINGE 26G X 3/8"" 1 ML MISC": 3 refills | Status: DC

## 2023-01-11 MED ORDER — ETONOGESTREL-ETHINYL ESTRADIOL 0.12-0.015 MG/24HR VA RING: 1.0000 | VAGINAL_RING | VAGINAL | 3 refills | Status: DC

## 2023-01-20 ENCOUNTER — Telehealth: Payer: Self-pay

## 2023-01-20 ENCOUNTER — Encounter: Payer: Self-pay | Admitting: Gastroenterology

## 2023-01-20 NOTE — Telephone Encounter (Signed)
[name], [name]  [facility] Clinical Pool Phone Number: [phone]   Hi Lacey Hess-  I've been on the trulance since March 2 (starting with samples) and picked up the prescription March 8. Do I have to be on it until April 8 before you can submit the motegrity to my insurance again? Or can it be sooner??  My symptoms are now periodic diarrhea (1-2 times a week) and most days I'm bloated, gassy, and incredibly uncomfortable with infrequent and small bowel movements.  I'm worried about the possibility I'll induce another nausea/vomiting episode from constipation. Would you or Dr. Silverio Decamp recommend a bowel prep/purge?  Thank you! Lacey Hess

## 2023-01-20 NOTE — Telephone Encounter (Signed)
Please advise on the message. Thanks

## 2023-01-26 ENCOUNTER — Other Ambulatory Visit: Payer: Self-pay

## 2023-01-26 MED ORDER — MOTEGRITY 2 MG PO TABS: 2.0000 mg | ORAL_TABLET | Freq: Every day | ORAL | 6 refills | Status: DC

## 2023-01-26 NOTE — Telephone Encounter (Signed)
Patient notified via My Chart. Rx to [facility].

## 2023-01-26 NOTE — Telephone Encounter (Signed)
Please send prescription for Motegrity given she has failed Trulance.  If insurance needs further documentation, she will need an office follow-up visit either with me or APP soon.  Thank you

## 2023-02-05 ENCOUNTER — Telehealth: Payer: Self-pay

## 2023-02-05 NOTE — Telephone Encounter (Signed)
Patient needs a PA for Motegrity please. She has tried and failed Linzess and Trulance as required by her insurance. Thanks

## 2023-02-10 NOTE — Telephone Encounter (Signed)
The patient is requesting the status of this prior authorization. She is on Trulance and is experiencing diarrhea.  Motegrity was prescribed for her and will require a PA.

## 2023-02-11 NOTE — Telephone Encounter (Signed)
PA is being resubmitted and is pending additional questions/determination.  Key: [id number]

## 2023-02-22 NOTE — Telephone Encounter (Signed)
PA for Motegrity has been APPROVED from 02/11/2023-02/11/2024

## 2023-04-21 ENCOUNTER — Encounter: Payer: Self-pay | Admitting: Gastroenterology

## 2023-04-22 ENCOUNTER — Other Ambulatory Visit: Payer: Self-pay

## 2023-04-22 MED ORDER — ONDANSETRON 4 MG PO TBDP: 4.0000 mg | ORAL_TABLET | Freq: Three times a day (TID) | ORAL | 0 refills | Status: AC | PRN

## 2023-05-20 ENCOUNTER — Encounter: Payer: Self-pay | Admitting: Gastroenterology

## 2023-06-17 ENCOUNTER — Encounter: Payer: Self-pay | Admitting: Adult Health

## 2023-06-17 ENCOUNTER — Other Ambulatory Visit: Payer: Self-pay

## 2023-06-17 ENCOUNTER — Ambulatory Visit (INDEPENDENT_AMBULATORY_CARE_PROVIDER_SITE_OTHER): Payer: Self-pay | Admitting: Adult Health

## 2023-06-17 ENCOUNTER — Telehealth: Payer: Self-pay | Admitting: Lactation Services

## 2023-06-17 VITALS — BP 110/80 | HR 76 | Temp 98.7°F | Wt 177.0 lb

## 2023-06-17 DIAGNOSIS — M542 Cervicalgia: Secondary | ICD-10-CM

## 2023-06-17 MED ORDER — CYCLOBENZAPRINE HCL 10 MG PO TABS: 10.0000 mg | ORAL_TABLET | Freq: Three times a day (TID) | ORAL | 0 refills | Status: DC | PRN

## 2023-06-17 NOTE — Telephone Encounter (Signed)
Received PA request from [facility] for [place]. Spoke with Pharmacy and she reports Lacey Hess is covered.   The Pharmacy would like to know if OK to use Nuvaring along with Modafinil as can cause interactions. Reviewed I would sent a message to provider. Has been on Nuva Ring since 2021. Modafinil is prescribed by another provider.

## 2023-06-17 NOTE — Progress Notes (Signed)
[facility]rap[facility] [address] [place], [place] [address]   Office Visit Note  Patient Name: Lacey Hess Date of Birth [id number]  Medical Record number [id number]  Date of Service: 06/17/2023  Chief Complaint  Patient presents with   Back Pain    Patient reports L-sided back and neck pain since Tuesday. She is having difficulty turning her head to the left side. It has also been causing her headaches. She has tried heat on the area which was helpful. She has also been taking Tylenol. No injury reported.     Back Pain Pertinent negatives include no fever.   Pt is here for a sick visit. She reports while blow drying her hair two days ago she pulled a muscle in her neck.  It has been getting worse on and off throughout the two days. She is having difficulty turning from side to side.  She is tried Tylenol.   Current Medication:  Outpatient Encounter Medications as of 06/17/2023  Medication Sig   acetaminophen (TYLENOL) 500 MG tablet Take 500-1,000 mg by mouth every 8 (eight) hours as needed (for pain).    cyanocobalamin (VITAMIN B12) 1000 MCG/ML injection Inject 1 mL (1,000 mcg total) into the skin every 14 (fourteen) days. 1 B12 injection every 7 days x 4 weeks then resume every 14 days   cyclobenzaprine (FLEXERIL) 10 MG tablet Take 1 tablet (10 mg total) by mouth 3 (three) times daily as needed for muscle spasms.   escitalopram (LEXAPRO) 20 MG tablet Take 20 mg by mouth daily.   etonogestrel-ethinyl estradiol (NUVARING) 0.12-0.015 MG/24HR vaginal ring Place 1 each vaginally every 28 (twenty-eight) days. Insert vaginally and leave in place for 3 consecutive weeks, then remove for 1 week.   HUMIRA, 2 PEN, 40 MG/0.4ML PNKT INJECT 1 PEN UNDER THE SKIN EVERY 14 DAYS.   modafinil (PROVIGIL) 100 MG tablet Take 50 mg by mouth every morning.   MOTEGRITY 2 MG TABS Take 1 tablet (2 mg total) by mouth daily.   Multiple Vitamin (MULTIVITAMIN) tablet Take 1 tablet by mouth daily.    ondansetron (ZOFRAN ODT) 4 MG disintegrating tablet Take 1 tablet (4 mg total) by mouth every 8 (eight) hours as needed for nausea or vomiting.   [DISCONTINUED] busPIRone (BUSPAR) 7.5 MG tablet Take by mouth. (Patient not taking: Reported on 09/01/2022)   [DISCONTINUED] escitalopram (LEXAPRO) 10 MG tablet Take 10 mg by mouth daily.   [DISCONTINUED] Nitroglycerin 0.4 % OINT Apply pea-sized amount to the hemorrhoid twice daily as needed for pain until advised to discontinue. (Patient not taking: Reported on 08/07/2022)   [DISCONTINUED] polyethylene glycol (MIRALAX / GLYCOLAX) 17 g packet Take 34 g by mouth 2 (two) times daily. (Patient not taking: Reported on 11/05/2022)   [DISCONTINUED] pramoxine-hydrocortisone (PROCTOCREAM-HC) 1-1 % rectal cream Place 1 Application rectally 2 (two) times daily. (Patient not taking: Reported on 09/01/2022)   [DISCONTINUED] TUBERCULIN SYR 1CC/26GX3/8" (B-D TB SYRINGE 1CC/26GX3/8") 26G X 3/8" 1 ML MISC Use every 14 days to administer Vitamin B12 injection subq   No facility-administered encounter medications on file as of 06/17/2023.      Medical History: Past Medical History:  Diagnosis Date   Anxiety    B12 deficiency 2019   Crohn's disease (HCC) 12/2017     Vital Signs: BP 110/80 (BP Location: Left Arm)   Pulse 76   Temp 98.7 F (37.1 C)   Wt 177 lb (80.3 kg)   SpO2 97%   BMI 29.45 kg/m  Review of Systems  Constitutional:  Negative for chills, fatigue and fever.  Musculoskeletal:  Positive for back pain and neck pain.    Physical Exam Vitals reviewed.  Constitutional:      Appearance: Normal appearance.  Musculoskeletal:       Arms:     Comments: Neck/back pain as on diagram below.  Added discomfort with twisting or turning head.   Neurological:     Mental Status: She is alert.       Assessment/Plan: 1. Neck pain Take Flexeril at night as discussed. Consider heat for 20 minutes at a time.  Follow up via MyChart messenger if  symptoms fail to improve or may return to clinic as needed for worsening symptoms.   - cyclobenzaprine (FLEXERIL) 10 MG tablet; Take 1 tablet (10 mg total) by mouth 3 (three) times daily as needed for muscle spasms.  Dispense: 30 tablet; Refill: 0     General Counseling: Lacey Hess verbalizes understanding of the findings of todays visit and agrees with plan of treatment. I have discussed any further diagnostic evaluation that may be needed or ordered today. We also reviewed her medications today. she has been encouraged to call the office with any questions or concerns that should arise related to todays visit.   No orders of the defined types were placed in this encounter.   Meds ordered this encounter  Medications   cyclobenzaprine (FLEXERIL) 10 MG tablet    Sig: Take 1 tablet (10 mg total) by mouth 3 (three) times daily as needed for muscle spasms.    Dispense:  30 tablet    Refill:  0    Time spent:15 Minutes    Johnna Acosta AGNP-C Nurse Practitioner

## 2023-07-02 NOTE — Telephone Encounter (Signed)
Per Edd Arbour, CNM in regards to taking Nuvaring and Modafinil together. "it is fine, the interaction is that modafinil can make Nuvaring less effective birth control. She uses it for control of dysmenorrhea, not birth control. She's been taking modafinil since before I saw her and made no mention of the Nuvaring being less effective since starting modafinil. "  Called and spoke with Pharmacist to let her know of response from provider.

## 2023-07-05 ENCOUNTER — Other Ambulatory Visit: Payer: Self-pay | Admitting: Gastroenterology

## 2023-07-07 ENCOUNTER — Encounter: Payer: Self-pay | Admitting: Gastroenterology

## 2023-07-12 ENCOUNTER — Telehealth: Payer: Self-pay | Admitting: *Deleted

## 2023-07-12 NOTE — Telephone Encounter (Signed)
Spoke with patient today and went over every question on her forms,will email to her at [email or web address] once Iv completed in [place]

## 2023-07-12 NOTE — Telephone Encounter (Signed)
Called and left message for the patient that we need to go over her [employer] forms together. Im not sure what she is looking for out of her forms.... Accomodations? Intermittent leave? Or work from home?

## 2023-07-12 NOTE — Telephone Encounter (Signed)
Finished paper work and Warden/ranger to her as requested

## 2023-08-20 NOTE — Progress Notes (Signed)
Lacey Hess    [id number]    11-15-1980  Primary Care Physician:Tysinger, Kermit Balo, PA-C  Referring Physician: Jac Canavan, PA-C [address] [place],  [place] [address]   Chief complaint:   Chief Complaint  Patient presents with   Crohn's Disease    Pt states she experience discomfort every day.    HPI: 42 year old very pleasant female with Crohn's disease here for follow-up visit with complaints of constipation Last seen on 08-07-22.  Today, she states that her bowel habits have changed since starting work in-person. She states that with Motegrity 2 mg daily which she take's first thing in the morning, she's typically having a BM once every other day. She doesn't usually fell fully evacuated and states that doing a bowel purge typically drains her out. She is also experiencing accompanying bloating and abdominal discomfort. She reports occasionally experience's a diarrhea episode every now and then. She has been using an OTC supplement (Seeds synbiotic) which has been helpful in managing her symptoms.   She reports compliance with pelvic floor physical therapy. She states that the "balloon" therapy seemed to help but is hard to maintain. She states that she was previously on 4 capfuls of Miralax daily and thus she is not interested in a further increase of laxatives as she is worried about further damaging her sphincter muscle. She is interesting in trying out FODMAP diet and reports that her water intake is currently at 90 oz daily. She states that she has normal menstrual cycles and denies being diagnosed with PCOS.   Patient denies any nausea, blood in stool, black stool, vomiting, unintentional weight loss, reflux, dysphagia or rash.   GI Hx: Colonoscopy 09-01-22  - The examined portion of the ileum was normal.  - Non-bleeding external and internal hemorrhoids.  - The examination was otherwise normal.  - Simple Endoscopic Score for Crohn's Disease: 0,  mucosal inflammatory changes secondary to Crohn's disease, in remission.  - No specimens collected.  Small bowel video capsule Mar 23, 2022: Incomplete exam.  Negative for active inflammation or Crohn's disease   CT abdomen pelvis February 25, 2022: Negative for any acute intra-abdominal pathology.  No bowel obstruction or thickening to suggest acute Crohn's flare   Right upper quadrant ultrasound December 16, 2021 was negative for any acute pathology, no cholelithiasis or cholecystitis   She is no longer following with dietitian or physical therapy, according to [facility] both of them felt that they have done everything and did not have any other options available to improve her symptoms   Mother diagnosed with Crohn's disease in 2021 and and has ileostomy with high output.  She is worried if she ever has to undergo a colostomy or an ileostomy  On Humira biweekly injection.    Humira antibody level detected at 28 and drug level 16 on September 06, 2018.    Colonoscopy December 06, 2017 showed multiple aphthous ulcers in terminal ileum, biopsy consistent with erosions and active inflammation. CT enterography April 2019 with contrast showed mild wall thickening and abnormal mucosal enhancement in terminal ileum highly suspicious for Crohn's disease.   Current Outpatient Medications:    acetaminophen (TYLENOL) 500 MG tablet, Take 500-1,000 mg by mouth every 8 (eight) hours as needed (for pain). , Disp: , Rfl:    cyanocobalamin (VITAMIN B12) 1000 MCG/ML injection, Inject 1 mL (1,000 mcg total) into the skin every 14 (fourteen) days. 1 B12 injection every 7 days x  4 weeks then resume every 14 days, Disp: 2 mL, Rfl: 11   cyclobenzaprine (FLEXERIL) 10 MG tablet, Take 1 tablet (10 mg total) by mouth 3 (three) times daily as needed for muscle spasms., Disp: 30 tablet, Rfl: 0   escitalopram (LEXAPRO) 20 MG tablet, Take 20 mg by mouth daily., Disp: , Rfl:    etonogestrel-ethinyl estradiol (NUVARING) 0.12-0.015  MG/24HR vaginal ring, Place 1 each vaginally every 28 (twenty-eight) days. Insert vaginally and leave in place for 3 consecutive weeks, then remove for 1 week., Disp: 3 each, Rfl: 3   HUMIRA, 2 PEN, 40 MG/0.4ML pen, INJECT THE CONTENTS OF 1 PEN UNDER THE SKIN EVERY 14 DAYS, Disp: 2 each, Rfl: 6   modafinil (PROVIGIL) 100 MG tablet, Take 200 mg by mouth every morning., Disp: , Rfl:    MOTEGRITY 2 MG TABS, Take 1 tablet (2 mg total) by mouth daily., Disp: 30 tablet, Rfl: 6   Multiple Vitamin (MULTIVITAMIN) tablet, Take 1 tablet by mouth daily., Disp: , Rfl:    ondansetron (ZOFRAN ODT) 4 MG disintegrating tablet, Take 1 tablet (4 mg total) by mouth every 8 (eight) hours as needed for nausea or vomiting., Disp: 20 tablet, Rfl: 0    Allergies as of 08/24/2023 - Review Complete 08/24/2023  Allergen Reaction Noted   Adhesive [tape] Rash 08/29/2018    Past Medical History:  Diagnosis Date   Anxiety    B12 deficiency 2019   Crohn's disease (HCC) 12/2017    Past Surgical History:  Procedure Laterality Date   ANAL RECTAL MANOMETRY N/A 12/16/2022   Procedure: ANO RECTAL MANOMETRY;  Surgeon: Napoleon Form, MD;  Location: [facility];  Service: Gastroenterology;  Laterality: N/A;   BREAST LUMPECTOMY Left    age 21yo   COLONOSCOPY  12/2017   mult ulcers in terminal ileum, othewise normal colon.  Dr. Marsa Aris    Family History  Problem Relation Age of Onset   Crohn's disease Mother    Hepatitis Father    Drug abuse Brother    Cancer Maternal Grandmother        lung   Cancer Paternal Grandmother        breast   Heart disease Paternal Grandmother        CHF   Diabetes Maternal Aunt    Heart disease Paternal Aunt        CHF   Colon cancer Neg Hx    Esophageal cancer Neg Hx    Rectal cancer Neg Hx    Stomach cancer Neg Hx     Social History   Socioeconomic History   Marital status: Married    Spouse name: Not on file   Number of children: 0   Years of education: Not  on file   Highest education level: Not on file  Occupational History   Occupation: [employer]  Tobacco Use   Smoking status: Never   Smokeless tobacco: Never  Vaping Use   Vaping status: Never Used  Substance and Sexual Activity   Alcohol use: Yes    Comment: 2-3 glasses weekly    Drug use: No   Sexual activity: Not on file  Other Topics Concern   Not on file  Social History Narrative   Works at [employer], Public house manager of Students, Consulting civil engineer support.  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  Exercise - peloton, yoga, weights.  01/2020.     Social Determinants of Health   Financial Resource Strain: Not on file  Food Insecurity: No Food Insecurity (  02/10/2021)   Hunger Vital Sign    Worried About Running Out of Food in the Last Year: Never true    Ran Out of Food in the Last Year: Never true  Transportation Needs: No Transportation Needs (02/10/2021)   PRAPARE - Administrator, Civil Service (Medical): No    Lack of Transportation (Non-Medical): No  Physical Activity: Not on file  Stress: Not on file  Social Connections: Not on file  Intimate Partner Violence: Not on file    Review of systems: Review of Systems  Constitutional:  Negative for unexpected weight change.  HENT:  Negative for trouble swallowing.   Gastrointestinal:  Positive for abdominal distention, abdominal pain, constipation and diarrhea. Negative for anal bleeding, blood in stool, nausea, rectal pain and vomiting.  Skin:  Negative for rash.      Physical Exam: Vitals:   08/24/23 1334  BP: 116/62  Pulse: 68   Body mass index is 28.46 kg/m. General: well-appearing   Eyes: sclera anicteric, no redness ENT: oral mucosa moist without lesions, no cervical or supraclavicular lymphadenopathy CV: RRR, no JVD, no peripheral edema Resp: clear to auscultation bilaterally, normal RR and effort noted GI: soft, no tenderness, with active bowel sounds. No guarding or palpable organomegaly noted. Skin; warm and dry, no rash or  jaundice noted Neuro: awake, alert and oriented x 3. Normal gross motor function and fluent speech   Data Reviewed:  Reviewed labs, radiology imaging, old records and pertinent past GI work up   Assessment and Plan/Recommendations:  42 year old very pleasant female with history of Crohn's disease, predominantly involving terminal ileum with IBS-constipation and pelvic outlet dysfunction     Crohn's Disease Stable with no complications. No new symptoms or flares. -Continue current management. -Order routine IBD health maintenance labs including liver tests, kidney tests, blood counts, iron level, B12, vitamin D, and TB test. -Check fecal calprotectin to ensure no flares.  Irritable Bowel Syndrome with Constipation (IBS-C) Reports bloating and inconsistent bowel movements despite Motegrity use. -Consider adjusting timing of Motegrity to evening to promote morning bowel movements. -Add Miralax in the morning for additional support. -Consider low FODMAP diet to identify potential dietary triggers.  Pelvic Floor Dysfunction Continues to struggle with complete evacuation unless stool is liquid. Ongoing physical therapy. -Continue physical therapy. -Adjust bowel regimen to promote more regular and complete evacuation. -Consider the impact of stool consistency on evacuation and adjust laxative use accordingly.  Follow-up in 6 months or sooner if any new symptoms or concerns arise.      This visit required >30 minutes of patient care (this includes precharting, chart review, review of results, face-to-face time used for counseling as well as treatment plan and follow-up. The patient was provided an opportunity to ask questions and all were answered. The patient agreed with the plan and demonstrated an understanding of the instructions.  Iona Beard , MD  Ladona Mow [employer]itt [employer]orts as a scribe for Marsa Aris, MD.,have documented all relevant documentation on the behalf of Marsa Aris, MD,as directed by  Marsa Aris, MD while in the presence of Marsa Aris, MD.   I, Marsa Aris, MD, have reviewed all documentation for this visit. The documentation on 08/24/23 for the exam, diagnosis, procedures, and orders are all accurate and complete.    CC: Tysinger, Kermit Balo, PA-C

## 2023-08-24 ENCOUNTER — Encounter: Payer: Self-pay | Admitting: Gastroenterology

## 2023-08-24 ENCOUNTER — Ambulatory Visit: Payer: BC Managed Care – PPO | Admitting: Gastroenterology

## 2023-08-24 VITALS — BP 116/62 | HR 68 | Ht 65.0 in | Wt 171.0 lb

## 2023-08-24 DIAGNOSIS — K581 Irritable bowel syndrome with constipation: Secondary | ICD-10-CM

## 2023-08-24 DIAGNOSIS — M6289 Other specified disorders of muscle: Secondary | ICD-10-CM

## 2023-08-24 DIAGNOSIS — K5080 Crohn's disease of both small and large intestine without complications: Secondary | ICD-10-CM

## 2023-08-24 NOTE — Patient Instructions (Addendum)
Your provider has requested that you go to the basement level for lab work before leaving today. Press "B" on the elevator. The lab is located at the first door on the left as you exit the elevator.   Due to recent changes in healthcare laws, you may see the results of your imaging and laboratory studies on MyChart before your provider has had a chance to review them.  We understand that in some cases there may be results that are confusing or concerning to you. Not all laboratory results come back in the same time frame and the provider may be waiting for multiple results in order to interpret others.  Please give [place] 48 hours in order for your provider to thoroughly review all the results before contacting the office for clarification of your results.   VISIT SUMMARY:  During our appointment, we discussed your ongoing issues with Crohn's disease, Irritable Bowel Syndrome with constipation (IBS-C), and pelvic floor dysfunction. You mentioned a change in bowel habits since transitioning to an office setting and despite taking Motegrity, you're still experiencing inconsistent bowel movements. You've also been trying different strategies to manage your symptoms, including a juice cleanse, physical therapy, and considering a low FODMAP diet.  YOUR PLAN:  -CROHN'S DISEASE: Your Crohn's disease is stable with no new symptoms or complications. We will continue with your current management plan and I've ordered some routine tests to monitor your health.  -IRRITABLE BOWEL SYNDROME WITH CONSTIPATION (IBS-C): You've reported bloating and inconsistent bowel movements despite taking Motegrity. We will consider adjusting the timing of your Motegrity dose to the evening and adding Miralax in the morning for additional support. We will also consider a low FODMAP diet to identify potential dietary triggers. Trial of Low FODMAP diet for 2 weeks and reintroduce different food groups slowly as tolerated  -PELVIC FLOOR  DYSFUNCTION: You're continuing to struggle with complete evacuation unless the stool is liquid. We will continue with your physical therapy and adjust your bowel regimen to promote more regular and complete evacuation. We will also consider the impact of stool consistency on evacuation and adjust your laxative use accordingly.  INSTRUCTIONS:  Please continue with your current treatment plans and the adjustments we discussed. I've ordered some routine tests to monitor your health. We will follow up in 6 months or sooner if any new symptoms or concerns arise.  I Low-FODMAP Eating Plan  FODMAP stands for fermentable oligosaccharides, disaccharides, monosaccharides, and polyols. These are sugars that are hard for some people to digest. A low-FODMAP eating plan may help some people who have irritable bowel syndrome (IBS) and certain other bowel (intestinal) diseases to manage their symptoms. This meal plan can be complicated to follow. Work with a diet and nutrition specialist (dietitian) to make a low-FODMAP eating plan that is right for you. A dietitian can help make sure that you get enough nutrition from this diet. What are tips for following this plan? Reading food labels Check labels for hidden FODMAPs such as: High-fructose syrup. Honey. Agave. Natural fruit flavors. Onion or garlic powder. Choose low-FODMAP foods that contain 3-4 grams of fiber per serving. Check food labels for serving sizes. Eat only one serving at a time to make sure FODMAP levels stay low. Shopping Shop with a list of foods that are recommended on this diet and make a meal plan. Meal planning Follow a low-FODMAP eating plan for up to 6 weeks, or as told by your health care provider or dietitian. To follow the eating  plan: Eliminate high-FODMAP foods from your diet completely. Choose only low-FODMAP foods to eat. You will do this for 2-6 weeks. Gradually reintroduce high-FODMAP foods into your diet one at a time.  Most people should wait a few days before introducing the next new high-FODMAP food into their meal plan. Your dietitian can recommend how quickly you may reintroduce foods. Keep a daily record of what and how much you eat and drink. Make note of any symptoms that you have after eating. Review your daily record with a dietitian regularly to identify which foods you can eat and which foods you should avoid. General tips Drink enough fluid each day to keep your urine pale yellow. Avoid processed foods. These often have added sugar and may be high in FODMAPs. Avoid most dairy products, whole grains, and sweeteners. Work with a dietitian to make sure you get enough fiber in your diet. Avoid high FODMAP foods at meals to manage symptoms. Recommended foods Fruits Bananas, oranges, tangerines, lemons, limes, blueberries, raspberries, strawberries, grapes, cantaloupe, honeydew melon, kiwi, papaya, passion fruit, and pineapple. Limited amounts of dried cranberries, banana chips, and shredded coconut. Vegetables Eggplant, zucchini, cucumber, peppers, green beans, bean sprouts, lettuce, arugula, kale, Swiss chard, spinach, collard greens, bok choy, summer squash, potato, and tomato. Limited amounts of corn, carrot, and sweet potato. Green parts of scallions. Grains Gluten-free grains, such as rice, oats, buckwheat, quinoa, corn, polenta, and millet. Gluten-free pasta, bread, or cereal. Rice noodles. Corn tortillas. Meats and other proteins Unseasoned beef, pork, poultry, or fish. Eggs. Tomasa Blase. Tofu (firm) and tempeh. Limited amounts of nuts and seeds, such as almonds, walnuts, [place] nuts, pecans, peanuts, nut butters, pumpkin seeds, chia seeds, and sunflower seeds. Dairy Lactose-free milk, yogurt, and kefir. Lactose-free cottage cheese and ice cream. Non-dairy milks, such as almond, coconut, hemp, and rice milk. Non-dairy yogurt. Limited amounts of goat cheese, brie, mozzarella, parmesan, swiss, and other  hard cheeses. Fats and oils Butter-free spreads. Vegetable oils, such as olive, canola, and sunflower oil. Seasoning and other foods Artificial sweeteners with names that do not end in "ol," such as aspartame, saccharine, and stevia. Maple syrup, white table sugar, raw sugar, brown sugar, and molasses. Mayonnaise, soy sauce, and tamari. Fresh basil, coriander, parsley, rosemary, and thyme. Beverages Water and mineral water. Sugar-sweetened soft drinks. Small amounts of orange juice or cranberry juice. Black and green tea. Most dry wines. Coffee. The items listed above may not be a complete list of foods and beverages you can eat. Contact a dietitian for more information. Foods to avoid Fruits Fresh, dried, and juiced forms of apple, pear, watermelon, peach, plum, cherries, apricots, blackberries, boysenberries, figs, nectarines, and mango. Avocado. Vegetables Chicory root, artichoke, asparagus, cabbage, snow peas, Brussels sprouts, broccoli, sugar snap peas, mushrooms, celery, and cauliflower. Onions, garlic, leeks, and the white part of scallions. Grains Wheat, including kamut, durum, and semolina. Barley and bulgur. Couscous. Wheat-based cereals. Wheat noodles, bread, crackers, and pastries. Meats and other proteins Fried or fatty meat. Sausage. Cashews and pistachios. Soybeans, baked beans, black beans, chickpeas, kidney beans, fava beans, navy beans, lentils, black-eyed peas, and split peas. Dairy Milk, yogurt, ice cream, and soft cheese. Cream and sour cream. Milk-based sauces. Custard. Buttermilk. Soy milk. Seasoning and other foods Any sugar-free gum or candy. Foods that contain artificial sweeteners such as sorbitol, mannitol, isomalt, or xylitol. Foods that contain honey, high-fructose corn syrup, or agave. Bouillon, vegetable stock, beef stock, and chicken stock. Garlic and onion powder. Condiments made with onion, such as hummus,  chutney, pickles, relish, salad dressing, and salsa.  Tomato paste. Beverages Chicory-based drinks. Coffee substitutes. Chamomile tea. Fennel tea. Sweet or fortified wines such as port or sherry. Diet soft drinks made with isomalt, mannitol, maltitol, sorbitol, or xylitol. Apple, pear, and mango juice. Juices with high-fructose corn syrup. The items listed above may not be a complete list of foods and beverages you should avoid. Contact a dietitian for more information. Summary FODMAP stands for fermentable oligosaccharides, disaccharides, monosaccharides, and polyols. These are sugars that are hard for some people to digest. A low-FODMAP eating plan is a short-term diet that helps to ease symptoms of certain bowel diseases. The eating plan usually lasts up to 6 weeks. After that, high-FODMAP foods are reintroduced gradually and one at a time. This can help you find out which foods may be causing symptoms. A low-FODMAP eating plan can be complicated. It is best to work with a dietitian who has experience with this type of plan. This information is not intended to replace advice given to you by your health care provider. Make sure you discuss any questions you have with your health care provider. Document Revised: 03/07/2020 Document Reviewed: 03/07/2020 Elsevier Patient Education  2024 [employer].   I appreciate the  opportunity to care for you  Thank You   Marsa Aris , MD

## 2023-08-25 ENCOUNTER — Encounter: Payer: Self-pay | Admitting: Gastroenterology

## 2023-08-26 ENCOUNTER — Other Ambulatory Visit: Payer: BC Managed Care – PPO

## 2023-08-26 DIAGNOSIS — K5080 Crohn's disease of both small and large intestine without complications: Secondary | ICD-10-CM | POA: Diagnosis not present

## 2023-08-26 DIAGNOSIS — M6289 Other specified disorders of muscle: Secondary | ICD-10-CM

## 2023-08-26 DIAGNOSIS — K581 Irritable bowel syndrome with constipation: Secondary | ICD-10-CM

## 2023-08-26 LAB — COMPREHENSIVE METABOLIC PANEL
ALT: 13 U/L (ref 0–35)
AST: 14 U/L (ref 0–37)
Albumin: 3.9 g/dL (ref 3.5–5.2)
Alkaline Phosphatase: 42 U/L (ref 39–117)
BUN: 8 mg/dL (ref 6–23)
CO2: 26 meq/L (ref 19–32)
Calcium: 9 mg/dL (ref 8.4–10.5)
Chloride: 104 meq/L (ref 96–112)
Creatinine, Ser: 0.78 mg/dL (ref 0.40–1.20)
GFR: 95.06 mL/min (ref >60.00)
Glucose, Bld: 85 mg/dL (ref 70–99)
Potassium: 3.8 meq/L (ref 3.5–5.1)
Sodium: 138 meq/L (ref 135–145)
Total Bilirubin: 0.4 mg/dL (ref 0.2–1.2)
Total Protein: 6.5 g/dL (ref 6.0–8.3)

## 2023-08-26 LAB — CBC WITH DIFFERENTIAL/PLATELET
Basophils Absolute: 0.1 10*3/uL (ref 0.0–0.1)
Basophils Relative: 1 % (ref 0.0–3.0)
Eosinophils Absolute: 0 10*3/uL (ref 0.0–0.7)
Eosinophils Relative: 0.1 % (ref 0.0–5.0)
HCT: 35.9 % — ABNORMAL LOW (ref 36.0–46.0)
Hemoglobin: 11.9 g/dL — ABNORMAL LOW (ref 12.0–15.0)
Lymphocytes Relative: 25.4 % (ref 12.0–46.0)
Lymphs Abs: 2 10*3/uL (ref 0.7–4.0)
MCHC: 33.2 g/dL (ref 30.0–36.0)
MCV: 96.3 fL (ref 78.0–100.0)
Monocytes Absolute: 0.4 10*3/uL (ref 0.1–1.0)
Monocytes Relative: 5.2 % (ref 3.0–12.0)
Neutro Abs: 5.5 10*3/uL (ref 1.4–7.7)
Neutrophils Relative %: 68.3 % (ref 43.0–77.0)
Platelets: 213 10*3/uL (ref 150.0–400.0)
RBC: 3.72 Mil/uL — ABNORMAL LOW (ref 3.87–5.11)
RDW: 13.2 % (ref 11.5–15.5)
WBC: 8 10*3/uL (ref 4.0–10.5)

## 2023-08-26 LAB — IBC + FERRITIN
Ferritin: 19.2 ng/mL (ref 10.0–291.0)
Iron: 158 ug/dL — ABNORMAL HIGH (ref 42–145)
Saturation Ratios: 37.6 % (ref 20.0–50.0)
TIBC: 420 ug/dL (ref 250.0–450.0)
Transferrin: 300 mg/dL (ref 212.0–360.0)

## 2023-08-26 LAB — B12 AND FOLATE PANEL
Folate: 22 ng/mL (ref >5.9)
Vitamin B-12: 506 pg/mL (ref 211–911)

## 2023-08-26 LAB — HIGH SENSITIVITY CRP: CRP, High Sensitivity: 2.01 mg/L (ref 0.000–5.000)

## 2023-08-26 LAB — SEDIMENTATION RATE: Sed Rate: 1 mm/h (ref 0–20)

## 2023-08-27 ENCOUNTER — Encounter: Payer: Self-pay | Admitting: Gastroenterology

## 2023-08-29 LAB — QUANTIFERON-TB GOLD PLUS
Mitogen-NIL: 7.41 [IU]/mL
NIL: 0.04 [IU]/mL
QuantiFERON-TB Gold Plus: NEGATIVE
TB1-NIL: 0.01 [IU]/mL
TB2-NIL: 0.03 [IU]/mL

## 2023-09-20 ENCOUNTER — Encounter: Payer: Self-pay | Admitting: Gastroenterology

## 2023-09-21 ENCOUNTER — Other Ambulatory Visit: Payer: Self-pay

## 2023-09-21 MED ORDER — MOTEGRITY 2 MG PO TABS: 2.0000 mg | ORAL_TABLET | Freq: Every day | ORAL | 6 refills | Status: DC

## 2023-10-14 IMAGING — US US ABDOMEN LIMITED
1 series · 15 of 25 positions shown · non-contrast
Comparison: CT 12/08/2017

CLINICAL DATA: Abdominal pain.

Inflammatory bowel disease.
Crohn's disease without complication.
Iron deficiency anemia.
Vitamin B12 deficiency.
Nausea without vomiting.
Vomiting.
Constipation.
EXAM:
ULTRASOUND ABDOMEN LIMITED RIGHT UPPER QUADRANT

[Series 1: us abdomen limited ruq mc & wl · 15 of 55 slices shown]
[im 1/55]
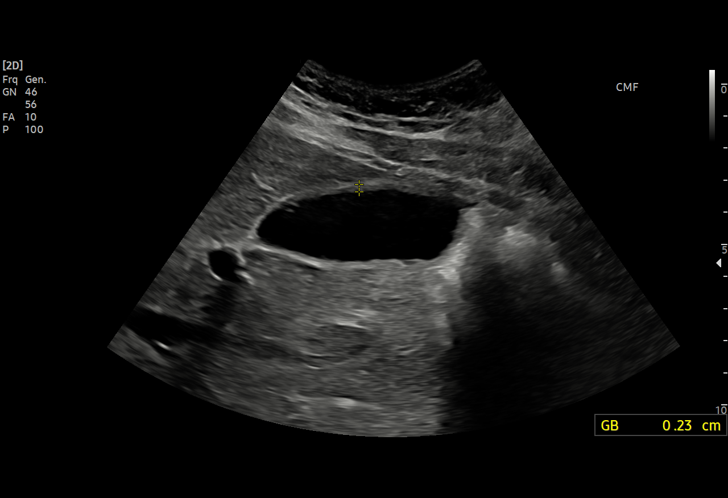
[im 5/55]
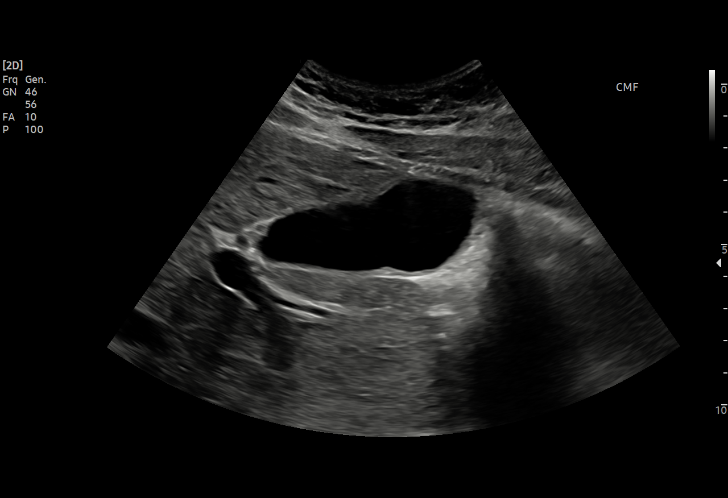
[im 10/55]
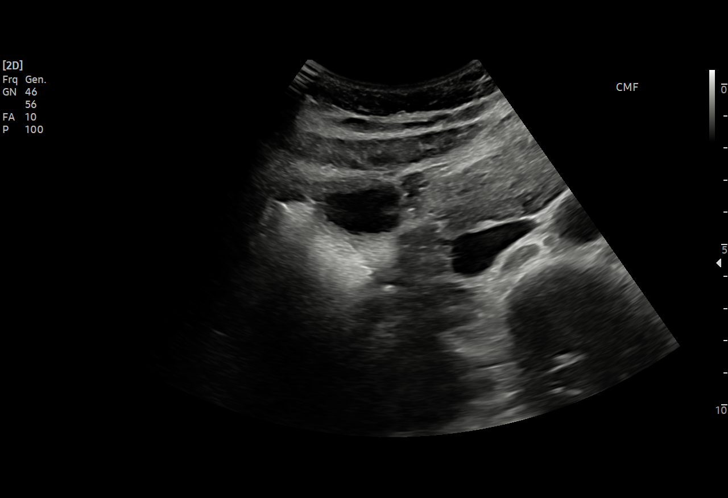
[im 12/55]
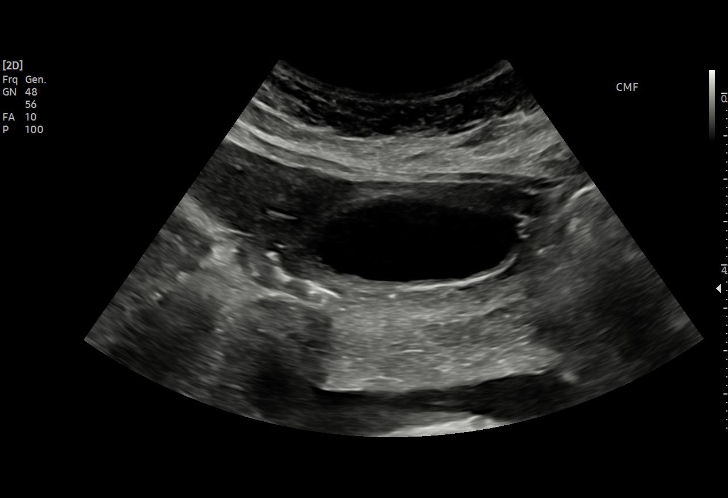
[im 16/55]
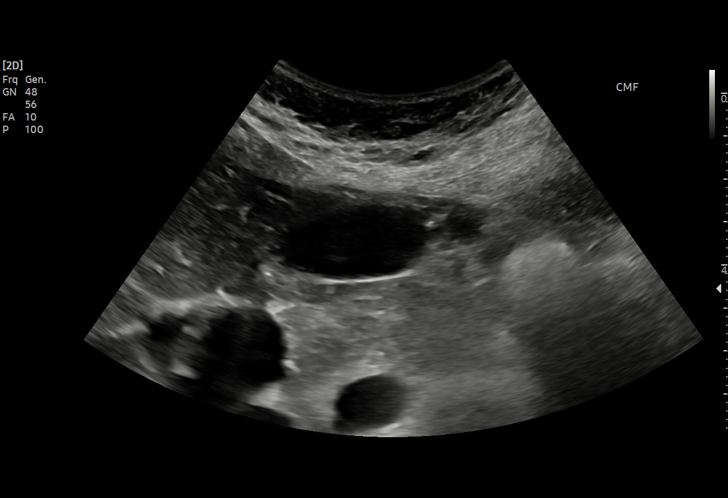
[im 21/55]
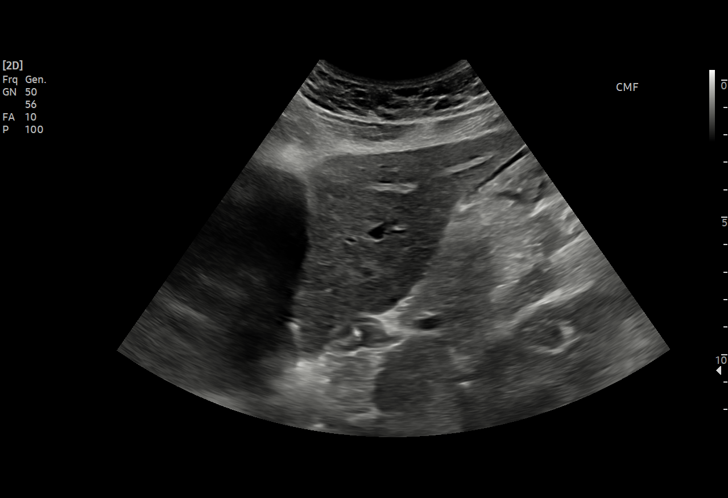
[im 23/55]
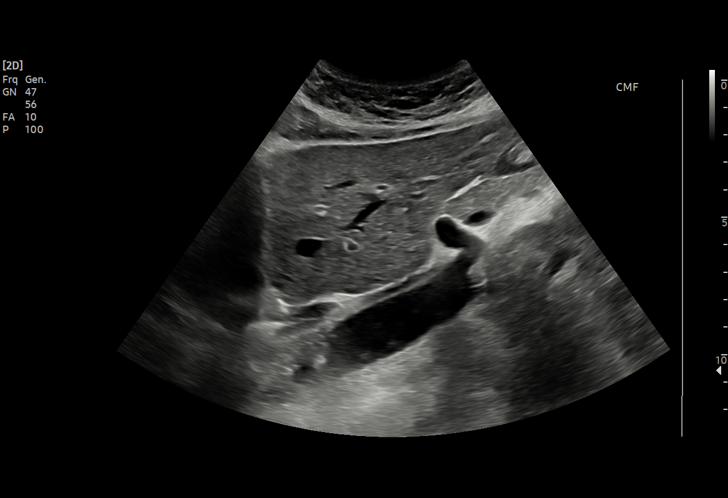
[im 28/55]
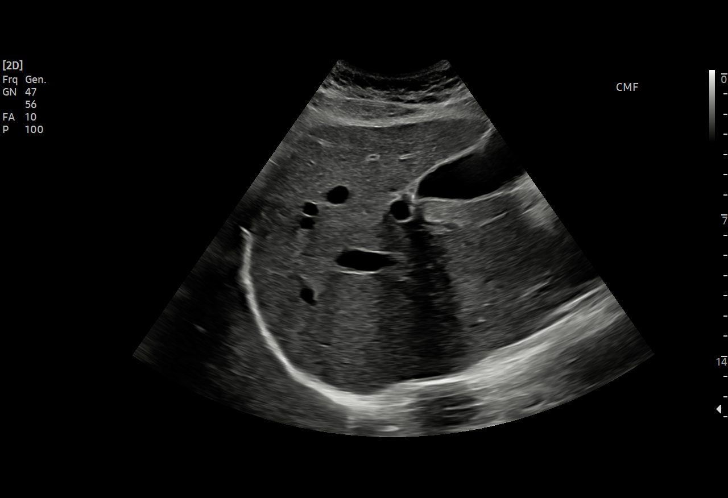
[im 32/55]
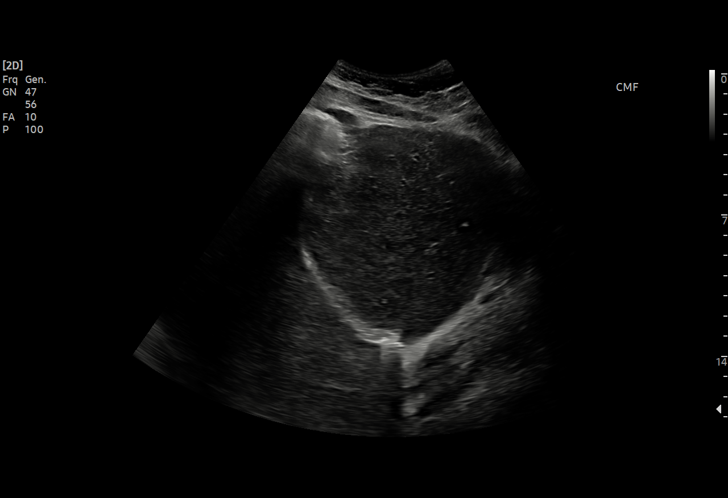
[im 34/55]
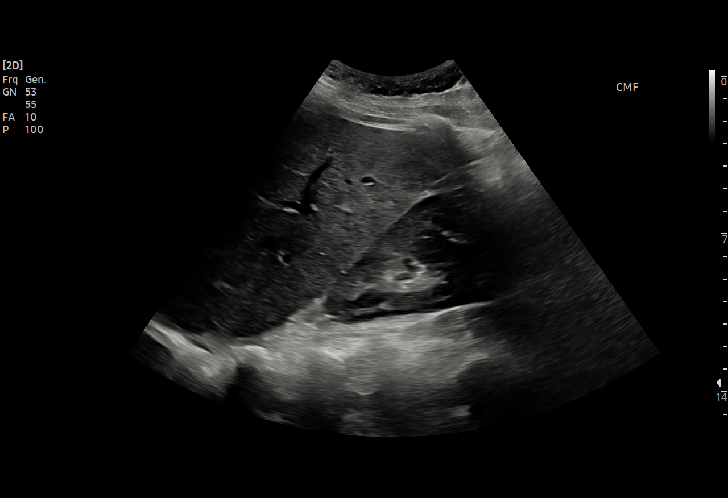
[im 39/55]
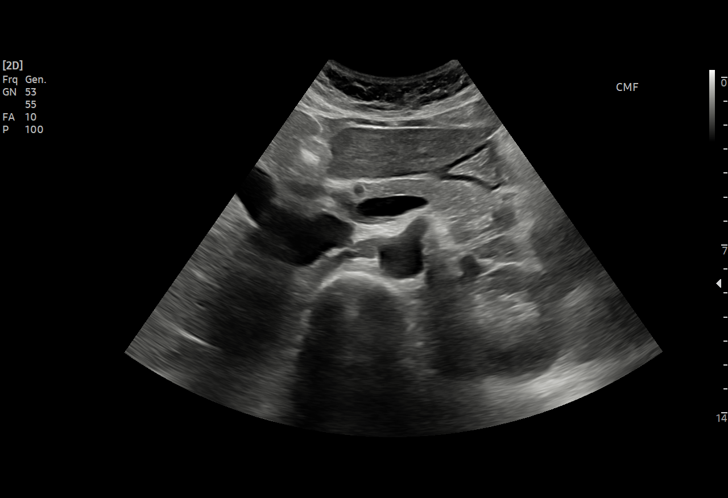
[im 43/55]
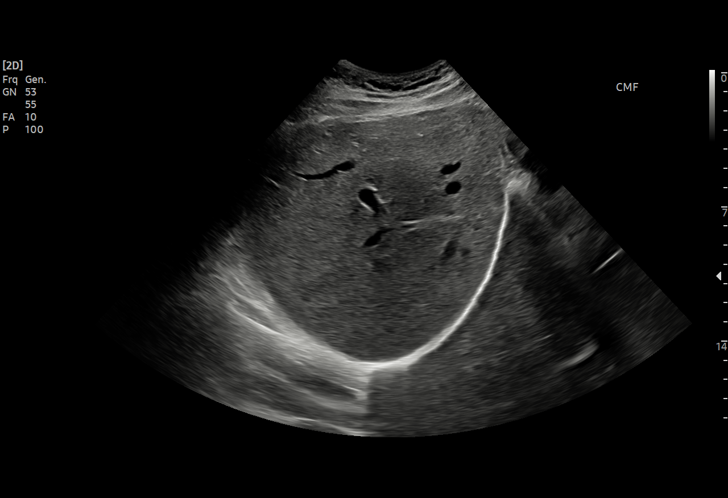
[im 46/55]
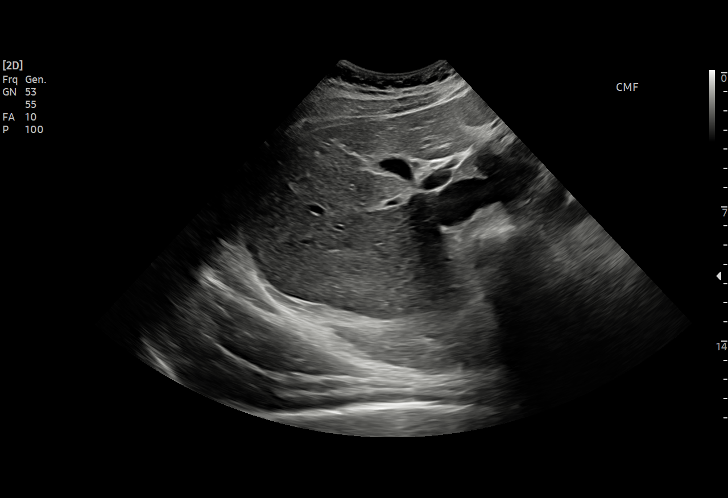
[im 50/55]
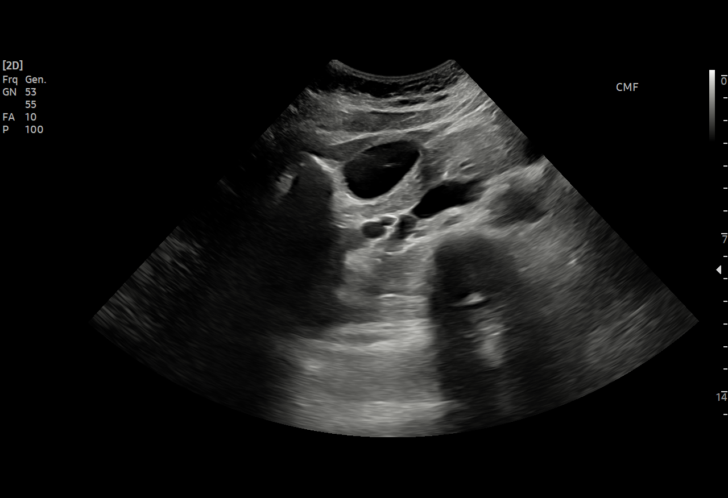
[im 55/55]
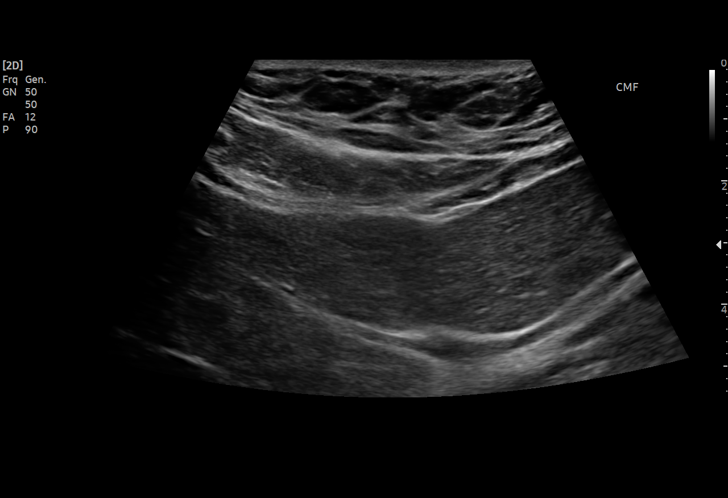

[15 of 25 positions shown; findings below may reference images not displayed]

FINDINGS: Gallbladder:

Physiologically distended. No gallstones or wall thickening
visualized. No sonographic Murphy sign noted by sonographer.

Common bile duct:

Diameter: 2 mm, normal.

Liver:

No focal lesion identified. Within normal limits in parenchymal
echogenicity. No intrahepatic biliary ductal dilatation. Portal vein
is patent on color Doppler imaging with normal direction of blood
flow towards the liver.

Other: No right upper quadrant ascites or free fluid.
IMPRESSION: Normal sonographic appearance of the liver, gallbladder and biliary
tree.

## 2023-10-19 ENCOUNTER — Encounter: Payer: Self-pay | Admitting: Gastroenterology

## 2023-10-19 ENCOUNTER — Other Ambulatory Visit: Payer: Self-pay | Admitting: *Deleted

## 2023-10-19 MED ORDER — TUBERCULIN-ALLERGY SYRINGES 26G X 3/8" 1 ML MISC: 1.0000 | 1 refills | Status: DC

## 2023-11-10 ENCOUNTER — Other Ambulatory Visit: Payer: Self-pay

## 2023-11-10 ENCOUNTER — Encounter: Payer: Self-pay | Admitting: Gastroenterology

## 2023-11-10 ENCOUNTER — Telehealth: Payer: Self-pay

## 2023-11-10 MED ORDER — CYANOCOBALAMIN 1000 MCG/ML IJ SOLN: 1000.0000 ug | INTRAMUSCULAR | 4 refills | Status: DC

## 2023-11-10 MED ORDER — TUBERCULIN-ALLERGY SYRINGES 26G X 3/8" 1 ML MISC: 1.0000 | 1 refills | Status: DC

## 2023-11-10 MED ORDER — MOTEGRITY 2 MG PO TABS: 2.0000 mg | ORAL_TABLET | Freq: Every day | ORAL | 3 refills | Status: DC

## 2023-11-10 NOTE — Telephone Encounter (Addendum)
 The prior authorization for patient's Humira will expire in February. Can we submit for a renewal? She may have a different insurance now. She is due her next injection 11/23/23. Elevating this request.

## 2023-11-15 ENCOUNTER — Other Ambulatory Visit: Payer: Self-pay

## 2023-11-15 ENCOUNTER — Encounter: Payer: Self-pay | Admitting: Gastroenterology

## 2023-11-15 MED ORDER — HUMIRA (2 PEN) 40 MG/0.4ML ~~LOC~~ AJKT: AUTO-INJECTOR | SUBCUTANEOUS | 6 refills | Status: DC

## 2023-11-17 ENCOUNTER — Telehealth: Payer: Self-pay

## 2023-11-17 ENCOUNTER — Other Ambulatory Visit: Payer: Self-pay

## 2023-11-17 ENCOUNTER — Other Ambulatory Visit (HOSPITAL_COMMUNITY): Payer: Self-pay

## 2023-11-17 DIAGNOSIS — K5229 Other allergic and dietetic gastroenteritis and colitis: Secondary | ICD-10-CM

## 2023-11-17 NOTE — Telephone Encounter (Signed)
 Pharmacy Patient Advocate Encounter   Received notification from Pt Calls Messages that prior authorization for Humira  (2 Pen) (CF) 40MG /0.4ML auto-injector kit is required/requested.   Insurance verification completed.   The patient is insured through [facility] .   Prior Authorization form/request asks a question that requires your assistance. Please see the question below and advise accordingly.

## 2023-11-24 NOTE — Telephone Encounter (Signed)
Pharmacy Patient Advocate Encounter  Received notification from [facility] that Prior Authorization for HUMIRA 40MG  has been DENIED.  Full denial letter will be uploaded to the media tab. See denial reason below.   PA #/Case ID/Reference #: [id number]

## 2023-11-24 NOTE — Telephone Encounter (Signed)
Agree 

## 2023-11-25 ENCOUNTER — Telehealth: Payer: Self-pay | Admitting: Pharmacist

## 2023-11-25 NOTE — Telephone Encounter (Signed)
An electronic appeal has been submitted for Humira. Will advise when response is received, please be advised that most companies may take 30 days to make a decision. Appeal letter and all pertinent documentation was uploaded through CoverMyMeds on 11/25/2023 @7 :50am.    Request Reference Number: [id number].  Thank you, Dellie Burns, PharmD Clinical Pharmacist  [facility]  Direct Dial: [phone]

## 2023-11-26 ENCOUNTER — Telehealth: Payer: Self-pay

## 2023-11-26 NOTE — Telephone Encounter (Signed)
Patient has been notified the Motegrity is needing prior authorization

## 2023-11-28 ENCOUNTER — Other Ambulatory Visit (HOSPITAL_COMMUNITY): Payer: Self-pay

## 2023-11-28 ENCOUNTER — Telehealth: Payer: Self-pay | Admitting: Pharmacy Technician

## 2023-11-28 NOTE — Telephone Encounter (Signed)
Pharmacy Patient Advocate Encounter  Received notification from [facility] that Prior Authorization for HUMIRA 40MG  has been APPROVED from 1.15.25 to 7.24.25   PA #/Case ID/Reference #: [id number]

## 2023-11-28 NOTE — Telephone Encounter (Signed)
Pharmacy Patient Advocate Encounter   Received notification from CoverMyMeds that prior authorization for MOTEGRITY 2MG  is required/requested.   Insurance verification completed.   The patient is insured through [facility] .   Per test claim: PA required; PA submitted to above mentioned insurance via CoverMyMeds Key/confirmation #/EOC [id number] Status is pending

## 2023-11-29 ENCOUNTER — Other Ambulatory Visit: Payer: Self-pay

## 2023-11-29 MED ORDER — TUBERCULIN-ALLERGY SYRINGES 26G X 3/8" 1 ML MISC: 3 refills | Status: AC

## 2023-12-07 NOTE — Telephone Encounter (Signed)
Tried and failed steroids (budesonide) and she has been on Humira since 01/2018. Please appeal the decision.    What is the status of the appeal please?

## 2023-12-13 ENCOUNTER — Other Ambulatory Visit (HOSPITAL_COMMUNITY): Payer: Self-pay

## 2023-12-13 NOTE — Telephone Encounter (Signed)
 Pharmacy Patient Advocate Encounter  Received notification from [employer] that Prior Authorization for MOTEGRITY  2MG  has been APPROVED from 1.26.25 to 1.25.26. Unable to obtain price due to refill too soon rejection, last fill date 1.27.25 next available fill date4.4.25   PA #/Case ID/Reference #:  [id number]

## 2023-12-13 NOTE — Telephone Encounter (Signed)
What is the status of the appeal please?

## 2023-12-14 NOTE — Telephone Encounter (Signed)
Approved from 11/17/23 until 05/25/24. See letter under the media tab.

## 2023-12-23 ENCOUNTER — Ambulatory Visit: Payer: BC Managed Care – PPO | Admitting: Allergy & Immunology

## 2023-12-24 IMAGING — CT CT ABD-PELV W/ CM
2 of 4 series · 16 of 46 positions shown, 18 images · IV contrast (agent unspecified)
Comparison: 12/08/2017

CLINICAL DATA: Inflammatory bowel disease (IBD). Abdominal pain,
acute, nonlocalized abdominal pain-bowel habit changes. Vomiting.
Crohn's disease

EXAM:
CT ABDOMEN AND PELVIS WITH CONTRAST
TECHNIQUE: Multidetector CT imaging of the abdomen and pelvis was performed
using the standard protocol following bolus administration of
intravenous contrast.

[Series 2: abd pel w · axial · 0.84mm/px · z∈[+622,+1082]mm · 13 of 100 slices shown, 15 images]
[im 4/100  soft-tissue]
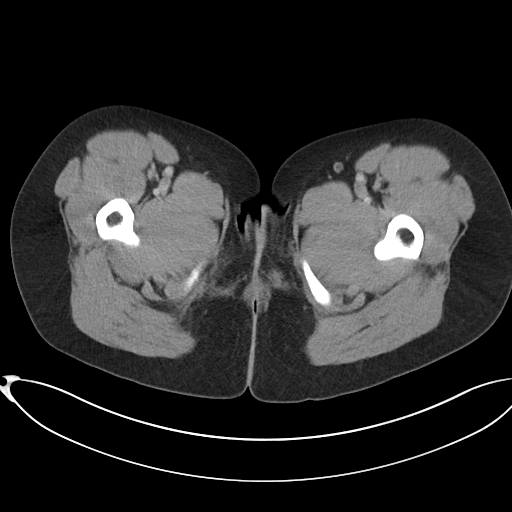
[im 4/100  bone]
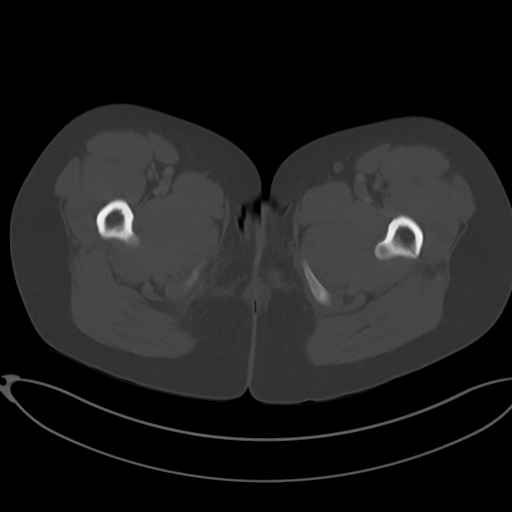
[im 12/100  soft-tissue]
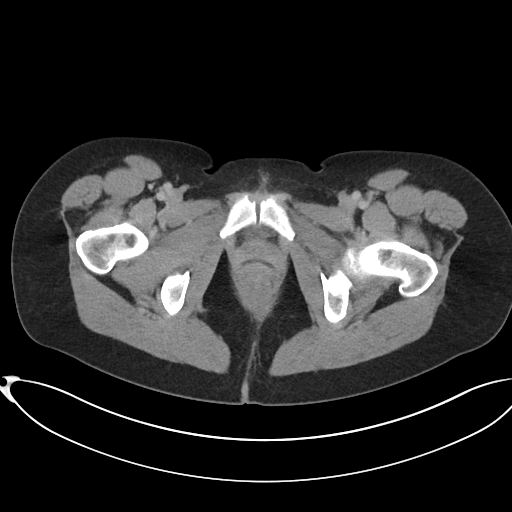
[im 20/100  soft-tissue]
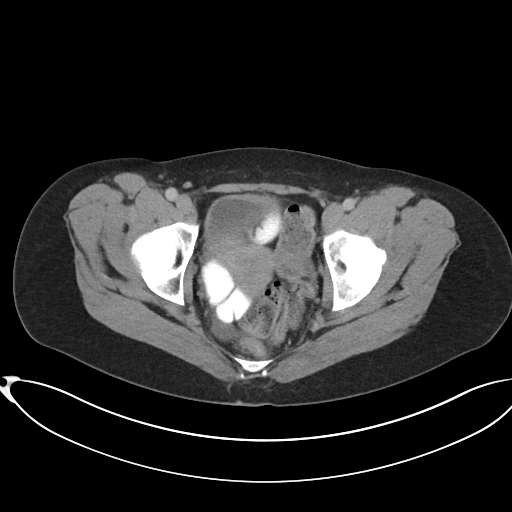
[im 28/100  soft-tissue]
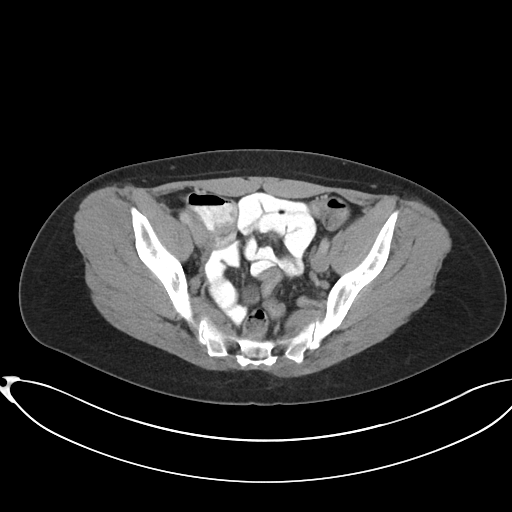
[im 36/100  soft-tissue]
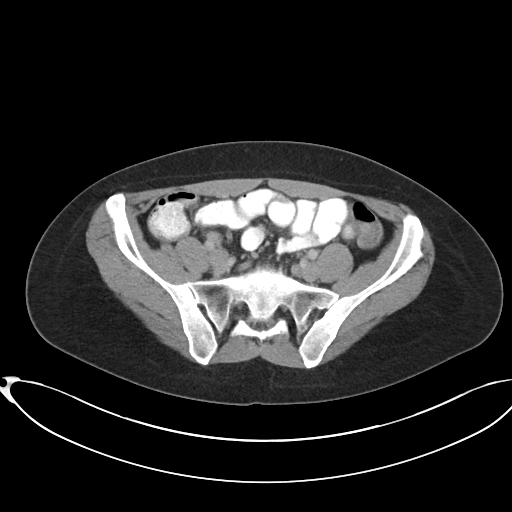
[im 44/100  soft-tissue]
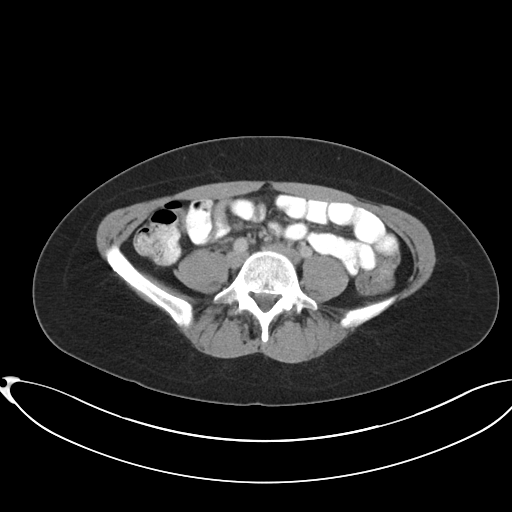
[im 52/100  soft-tissue]
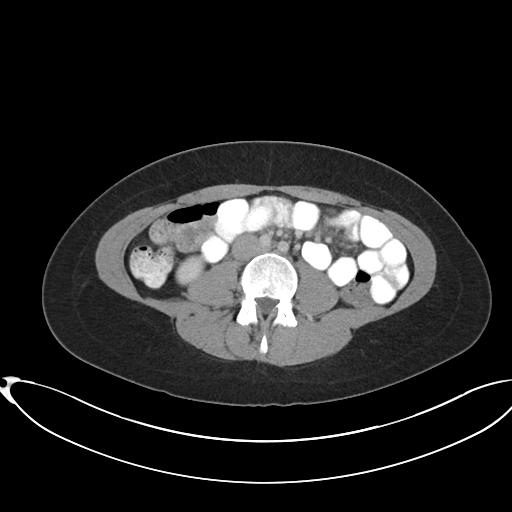
[im 56/100  soft-tissue]
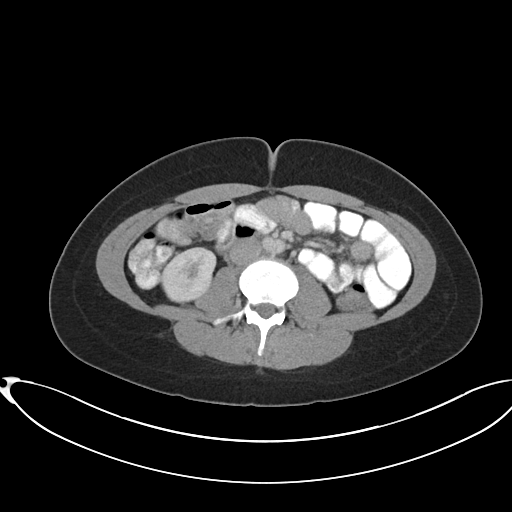
[im 64/100  soft-tissue]
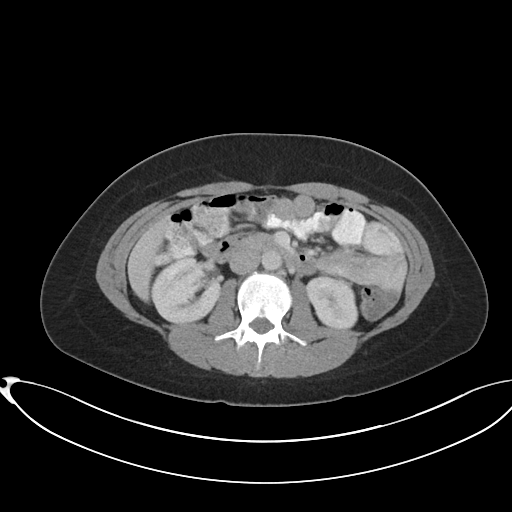
[im 64/100  bone]
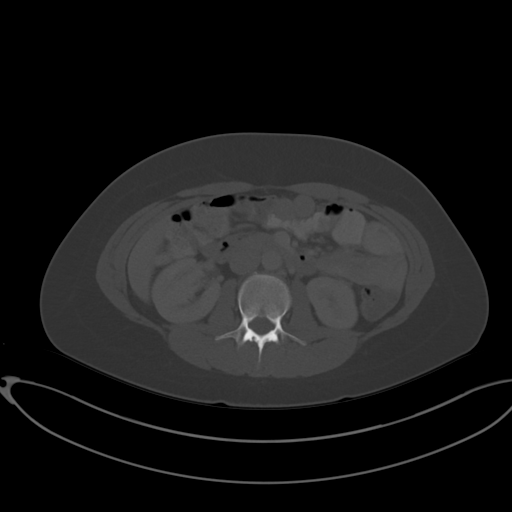
[im 72/100  soft-tissue]
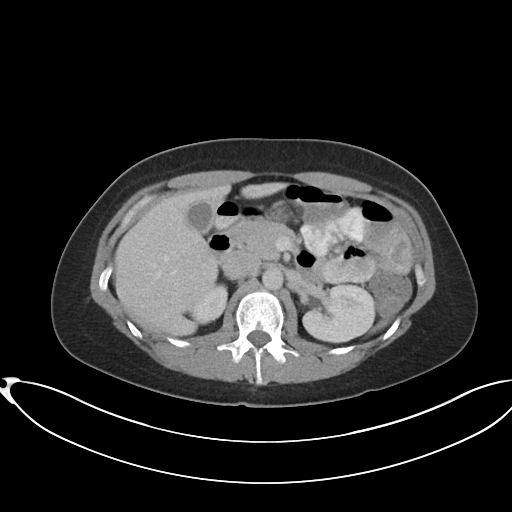
[im 80/100  soft-tissue]
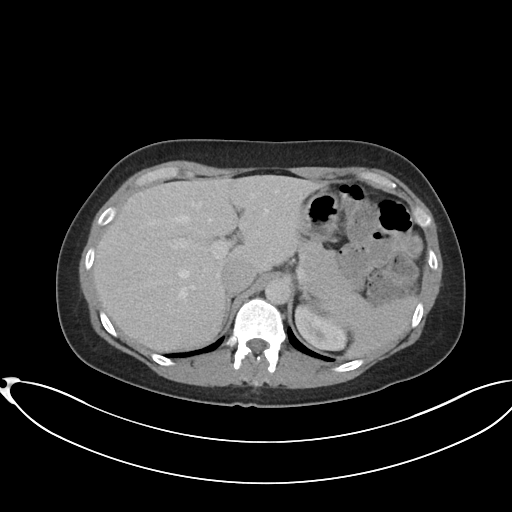
[im 88/100  soft-tissue]
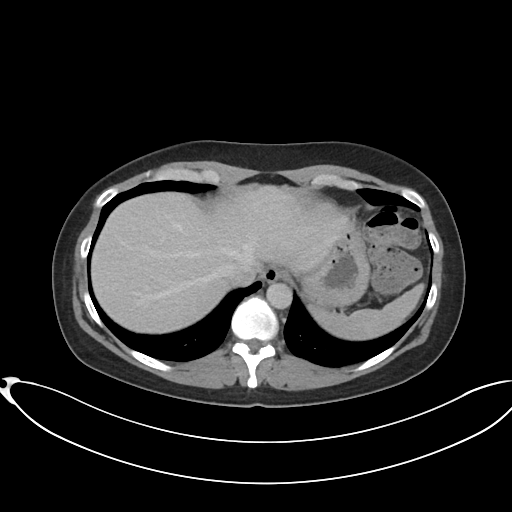
[im 96/100  soft-tissue]
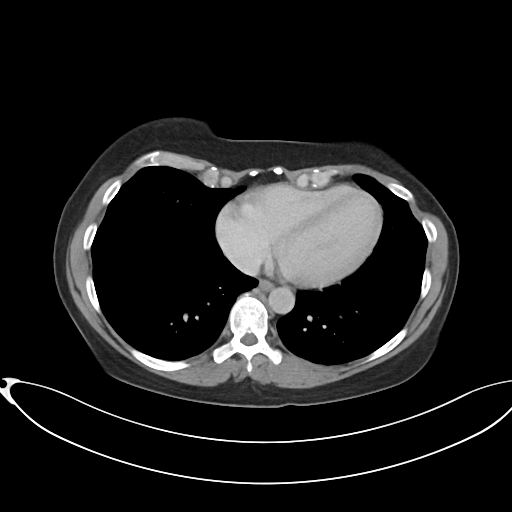

[Series 5: coronal · coronal · 0.81mm/px · 3 of 82 slices shown]
[im 28/82  soft-tissue]
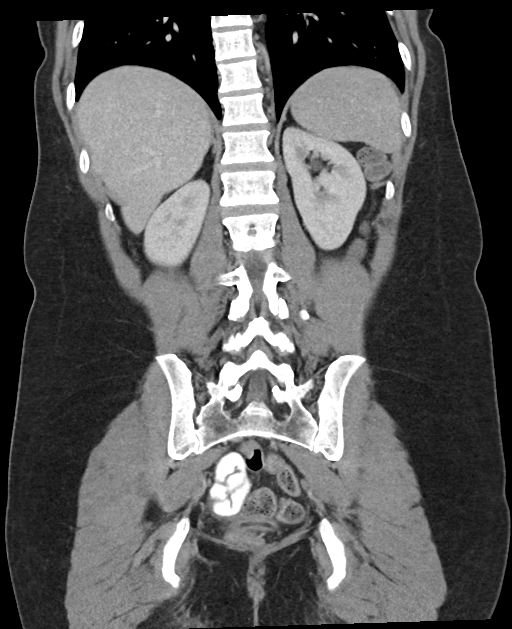
[im 37/82  soft-tissue]
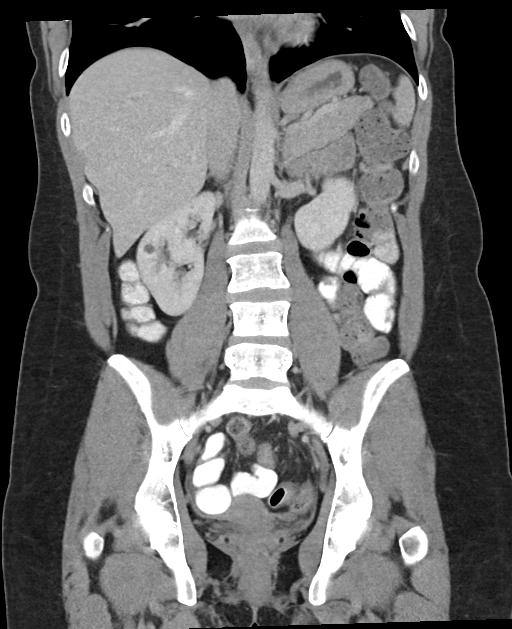
[im 46/82  soft-tissue]
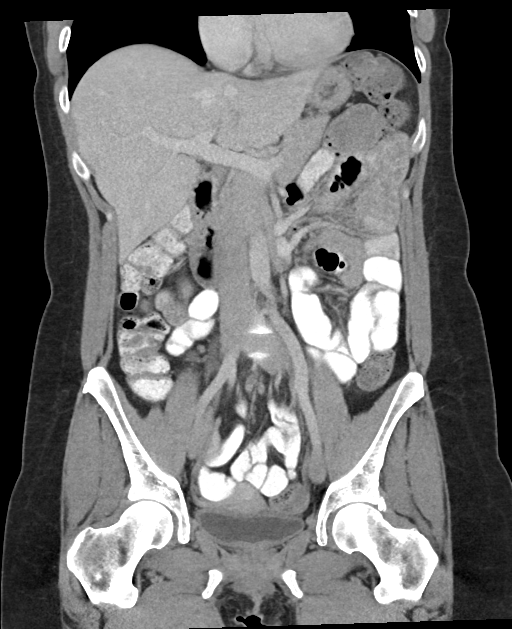

[16 of 46 positions shown; findings below may reference images not displayed]

RADIATION DOSE REDUCTION: This exam was performed according to the
departmental dose-optimization program which includes automated
exposure control, adjustment of the mA and/or kV according to
patient size and/or use of iterative reconstruction technique.

CONTRAST:  75mL OMNIPAQUE IOHEXOL 300 MG/ML  SOLN
FINDINGS: Lower chest: No acute abnormality.

Hepatobiliary: No focal liver abnormality is seen. No gallstones,
gallbladder wall thickening, or biliary dilatation.

Pancreas: Unremarkable

Spleen: Unremarkable

Adrenals/Urinary Tract: Adrenal glands are unremarkable. Kidneys are
normal, without renal calculi, focal lesion, or hydronephrosis.
Bladder is unremarkable.

Stomach/Bowel: The stomach, small bowel, and large bowel are
unremarkable. In particular, the terminal ileum is unremarkable.
There is no evidence of obstruction or focal inflammation. The
appendix is normal. No free intraperitoneal gas or fluid.

Vascular/Lymphatic: No significant vascular findings are present. No
enlarged abdominal or pelvic lymph nodes.

Reproductive: Uterus and bilateral adnexa are unremarkable.

Other: No abdominal wall hernia

Musculoskeletal: No acute bone abnormality. No lytic or blastic bone
lesion.
IMPRESSION: No acute intra-abdominal pathology identified. No definite
radiographic explanation for the patient's reported symptoms.

## 2024-01-06 ENCOUNTER — Other Ambulatory Visit (HOSPITAL_COMMUNITY): Payer: Self-pay

## 2024-01-09 NOTE — Progress Notes (Unsigned)
 New Patient Note  RE: Lacey Hess MRN: [id number] DOB: 1981/08/04 Date of Office Visit: 01/10/2024  Consult requested by: Napoleon Form, MD Primary care provider: Jac Canavan, PA-C  Chief Complaint: No chief complaint on file.  History of Present Illness: I had the pleasure of seeing Lacey Hess for initial evaluation at the [facility] on 01/09/2024. She is a 43 y.o. female, who is referred here by Jac Canavan, PA-C for the evaluation of food allergies.  Discussed the use of AI scribe software for clinical note transcription with the patient, who gave verbal consent to proceed.  History of Present Illness             She reports food allergy to ***. The reaction occurred at the age of ***, after she ate *** amount of ***. Symptoms started within *** and was in the form of *** hives, swelling, wheezing, abdominal pain, diarrhea, vomiting. ***Denies any associated cofactors such as exertion, infection, NSAID use, or alcohol consumption. The symptoms lasted for ***. She was evaluated in ED and received ***. Since this episode, she does *** not report other accidental exposures to ***. She does *** not have access to epinephrine autoinjector and *** needed to use it.   Past work up includes: ***. Dietary History: patient has been eating other foods including ***milk, ***eggs, ***peanut, ***treenuts, ***sesame, ***shellfish, ***fish, ***soy, ***wheat, ***meats, ***fruits and ***vegetables.  She reports reading labels and avoiding *** in diet completely. She tolerates ***baked egg and baked milk products.   08/24/2019 for GI visit: "43 year old very pleasant female with history of Crohn's disease, predominantly involving terminal ileum with IBS-constipation and pelvic outlet dysfunction     Crohn's Disease Stable with no complications. No new symptoms or flares. -Continue current management. -Order routine IBD health maintenance labs including  liver tests, kidney tests, blood counts, iron level, B12, vitamin D, and TB test. -Check fecal calprotectin to ensure no flares.   Irritable Bowel Syndrome with Constipation (IBS-C) Reports bloating and inconsistent bowel movements despite Motegrity use. -Consider adjusting timing of Motegrity to evening to promote morning bowel movements. -Add Miralax in the morning for additional support. -Consider low FODMAP diet to identify potential dietary triggers.   Pelvic Floor Dysfunction Continues to struggle with complete evacuation unless stool is liquid. Ongoing physical therapy. -Continue physical therapy. -Adjust bowel regimen to promote more regular and complete evacuation. -Consider the impact of stool consistency on evacuation and adjust laxative use accordingly."  Assessment and Plan: Lacey Hess is a 43 y.o. female with: ***  Assessment and Plan               No follow-ups on file.  No orders of the defined types were placed in this encounter.  Lab Orders  No laboratory test(s) ordered today    Other allergy screening: Asthma: {Blank single:19197::"yes","no"} Rhino conjunctivitis: {Blank single:19197::"yes","no"} Food allergy: {Blank single:19197::"yes","no"} Medication allergy: {Blank single:19197::"yes","no"} Hymenoptera allergy: {Blank single:19197::"yes","no"} Urticaria: {Blank single:19197::"yes","no"} Eczema:{Blank single:19197::"yes","no"} History of recurrent infections suggestive of immunodeficency: {Blank single:19197::"yes","no"}  Diagnostics: Spirometry:  Tracings reviewed. Her effort: {Blank single:19197::"Good reproducible efforts.","It was hard to get consistent efforts and there is a question as to whether this reflects a maximal maneuver.","Poor effort, data can not be interpreted."} FVC: ***L FEV1: ***L, ***% predicted FEV1/FVC ratio: ***% Interpretation: {Blank single:19197::"Spirometry consistent with mild obstructive disease","Spirometry  consistent with moderate obstructive disease","Spirometry consistent with severe obstructive disease","Spirometry consistent with possible restrictive disease","Spirometry consistent with mixed obstructive and restrictive disease","Spirometry uninterpretable  due to technique","Spirometry consistent with normal pattern","No overt abnormalities noted given today's efforts"}.  Please see scanned spirometry results for details.  Skin Testing: {Blank single:19197::"Select foods","Environmental allergy panel","Environmental allergy panel and select foods","Food allergy panel","None","Deferred due to recent antihistamines use"}. *** Results discussed with patient/family.   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   Constipation due to outlet dysfunction 12/30/2022   Crohn's disease with complication (HCC) 01/11/2020   High risk medication use 01/11/2020   Family history of breast cancer 01/05/2020   Women's annual routine gynecological examination 01/05/2020   Crohn's disease involving terminal ileum (HCC) 05/25/2018   Other constipation 12/03/2017   Past Medical History:  Diagnosis Date   Anxiety    B12 deficiency 2019   Crohn's disease (HCC) 12/2017   Past Surgical History: Past Surgical History:  Procedure Laterality Date   ANAL RECTAL MANOMETRY N/A 12/16/2022   Procedure: ANO RECTAL MANOMETRY;  Surgeon: Napoleon Form, MD;  Location: [facility];  Service: Gastroenterology;  Laterality: N/A;   BREAST LUMPECTOMY Left    age 21yo   COLONOSCOPY  12/2017   mult ulcers in terminal ileum, othewise normal colon.  Dr. Marsa Aris   Medication List:  Current Outpatient Medications  Medication Sig Dispense Refill   acetaminophen (TYLENOL) 500 MG tablet Take 500-1,000 mg by mouth every 8 (eight) hours as needed (for pain).      cyanocobalamin (VITAMIN B12) 1000 MCG/ML injection Inject 1 mL (1,000 mcg total) into  the skin every 14 (fourteen) days. 6 mL 4   cyclobenzaprine (FLEXERIL) 10 MG tablet Take 1 tablet (10 mg total) by mouth 3 (three) times daily as needed for muscle spasms. 30 tablet 0   escitalopram (LEXAPRO) 20 MG tablet Take 20 mg by mouth daily.     etonogestrel-ethinyl estradiol (NUVARING) 0.12-0.015 MG/24HR vaginal ring Place 1 each vaginally every 28 (twenty-eight) days. Insert vaginally and leave in place for 3 consecutive weeks, then remove for 1 week. 3 each 3   HUMIRA, 2 PEN, 40 MG/0.4ML pen INJECT THE CONTENTS OF 1 PEN UNDER THE SKIN EVERY 14 DAYS 2 each 6   modafinil (PROVIGIL) 100 MG tablet Take 200 mg by mouth every morning.     MOTEGRITY 2 MG TABS Take 1 tablet (2 mg total) by mouth daily. 90 tablet 3   Multiple Vitamin (MULTIVITAMIN) tablet Take 1 tablet by mouth daily.     ondansetron (ZOFRAN ODT) 4 MG disintegrating tablet Take 1 tablet (4 mg total) by mouth every 8 (eight) hours as needed for nausea or vomiting. 20 tablet 0   Tuberculin-Allergy Syringes 26G X 3/8" 1 ML MISC Use every 14 days to inject cyanocobalamin 25 each 3   No current facility-administered medications for this visit.   Allergies: Allergies  Allergen Reactions   Adhesive [Tape] Rash   Social History: Social History   Socioeconomic History   Marital status: Married    Spouse name: Not on file   Number of children: 0   Years of education: Not on file   Highest education level: Not on file  Occupational History   Occupation: [employer]  Tobacco Use   Smoking status: Never   Smokeless tobacco: Never  Vaping Use   Vaping status: Never Used  Substance and Sexual Activity   Alcohol use: Yes    Comment: 2-3 glasses weekly    Drug use: No   Sexual activity: Not on file  Other Topics Concern  Not on file  Social History Narrative   Works at [employer], Public house manager of Students, Consulting civil engineer support.  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  Exercise - peloton, yoga, weights.  01/2020.     Social Drivers of Manufacturing engineer Strain: Not on file  Food Insecurity: No Food Insecurity (02/10/2021)   Hunger Vital Sign    Worried About Running Out of Food in the Last Year: Never true    Ran Out of Food in the Last Year: Never true  Transportation Needs: No Transportation Needs (02/10/2021)   PRAPARE - Administrator, Civil Service (Medical): No    Lack of Transportation (Non-Medical): No  Physical Activity: Not on file  Stress: Not on file  Social Connections: Not on file   Lives in a ***. Smoking: *** Occupation: ***  Environmental HistorySurveyor, minerals in the house: Copywriter, advertising in the family room: {Blank single:19197::"yes","no"} Carpet in the bedroom: {Blank single:19197::"yes","no"} Heating: {Blank single:19197::"electric","gas","heat pump"} Cooling: {Blank single:19197::"central","window","heat pump"} Pet: {Blank single:19197::"yes ***","no"}  Family History: Family History  Problem Relation Age of Onset   Crohn's disease Mother    Hepatitis Father    Drug abuse Brother    Cancer Maternal Grandmother        lung   Cancer Paternal Grandmother        breast   Heart disease Paternal Grandmother        CHF   Diabetes Maternal Aunt    Heart disease Paternal Aunt        CHF   Colon cancer Neg Hx    Esophageal cancer Neg Hx    Rectal cancer Neg Hx    Stomach cancer Neg Hx    Problem                               Relation Asthma                                   *** Eczema                                *** Food allergy                          *** Allergic rhino conjunctivitis     ***  Review of Systems  Constitutional:  Negative for appetite change, chills, fever and unexpected weight change.  HENT:  Negative for congestion and rhinorrhea.   Eyes:  Negative for itching.  Respiratory:  Negative for cough, chest tightness, shortness of breath and wheezing.   Cardiovascular:  Negative for chest pain.  Gastrointestinal:   Negative for abdominal pain.  Genitourinary:  Negative for difficulty urinating.  Skin:  Negative for rash.  Neurological:  Negative for headaches.    Objective: There were no vitals taken for this visit. There is no height or weight on file to calculate BMI. Physical Exam Vitals and nursing note reviewed.  Constitutional:      Appearance: Normal appearance. She is well-developed.  HENT:     Head: Normocephalic and atraumatic.     Right Ear: Tympanic membrane and external ear normal.     Left Ear: Tympanic membrane and external ear normal.     Nose: Nose normal.  Mouth/Throat:     Mouth: Mucous membranes are moist.     Pharynx: Oropharynx is clear.  Eyes:     Conjunctiva/sclera: Conjunctivae normal.  Cardiovascular:     Rate and Rhythm: Normal rate and regular rhythm.     Heart sounds: Normal heart sounds. No murmur heard.    No friction rub. No gallop.  Pulmonary:     Effort: Pulmonary effort is normal.     Breath sounds: Normal breath sounds. No wheezing, rhonchi or rales.  Musculoskeletal:     Cervical back: Neck supple.  Skin:    General: Skin is warm.     Findings: No rash.  Neurological:     Mental Status: She is alert and oriented to person, place, and time.  Psychiatric:        Behavior: Behavior normal.    The plan was reviewed with the patient/family, and all questions/concerned were addressed.  It was my pleasure to see Lacey Hess today and participate in her care. Please feel free to contact me with any questions or concerns.  Sincerely,  Wyline Mood, DO Allergy & Immunology  [facility] office: [phone] [facility] office: [phone]

## 2024-01-10 ENCOUNTER — Encounter: Payer: Self-pay | Admitting: Allergy

## 2024-01-10 ENCOUNTER — Ambulatory Visit: Payer: BC Managed Care – PPO | Admitting: Allergy

## 2024-01-10 ENCOUNTER — Other Ambulatory Visit: Payer: Self-pay

## 2024-01-10 VITALS — BP 110/64 | HR 72 | Temp 98.7°F | Ht 65.35 in | Wt 166.2 lb

## 2024-01-10 DIAGNOSIS — K5080 Crohn's disease of both small and large intestine without complications: Secondary | ICD-10-CM

## 2024-01-10 DIAGNOSIS — R198 Other specified symptoms and signs involving the digestive system and abdomen: Secondary | ICD-10-CM | POA: Diagnosis not present

## 2024-01-10 DIAGNOSIS — E739 Lactose intolerance, unspecified: Secondary | ICD-10-CM | POA: Diagnosis not present

## 2024-01-10 NOTE — Patient Instructions (Addendum)
 Discussed with patient that skin prick testing and bloodwork (food IgE levels) checks for IgE mediated reactions which her clinical presentation does not support.  I don't check IgG bloodwork for foods as it has no clinical utility for me. Keep a food journal with symptoms and foods eaten.  Allergy: food allergy is when you have eaten a food, developed an allergic reaction after eating the food and have IgE to the food (positive food testing either by skin testing or blood testing).  Food allergy could lead to life threatening symptoms Sensitivity: occurs when you have IgE to a food (positive food testing either by skin testing or blood testing) but is a food you eat without any issues.  This is not an allergy and we recommend keeping the food in the diet Intolerance: this is when you have negative testing by either skin testing or blood testing thus not allergic but the food causes symptoms (like belly pain, bloating, diarrhea etc) with ingestion.  These foods should be avoided to prevent symptoms.     Lactose intolerance May use lactose free milk or take a lactaid pill right before consuming anything with dairy.  Motility diet: Yes. You might feel better if you: ?Eat 4 to 5 small meals during the day, instead of 2 or 3 big ones. ?Put food in the blender before eating it. ?Cut down on foods that have a lot of fat, such as cheese and fried foods. ?Cut down on foods that have a lot of "insoluble" fiber, such as some fruits, vegetables, and beans. ?Avoid fizzy drinks, like soda. They can cause more bloating and gas. ?Avoid alcohol and smoking.  Follow up if you are interested in skin testing.   Skin testing instructions: Will make additional recommendations based on results. Make sure you don't take any antihistamines for 3 days before the skin testing appointment. Don't put any lotion on the back and arms on the day of testing.  Plan on being here for 30-60 minutes.

## 2024-02-28 ENCOUNTER — Encounter: Payer: Self-pay | Admitting: Gastroenterology

## 2024-02-28 ENCOUNTER — Other Ambulatory Visit: Payer: Self-pay

## 2024-02-28 MED ORDER — MOTEGRITY 2 MG PO TABS: 2.0000 mg | ORAL_TABLET | Freq: Every day | ORAL | 3 refills | Status: DC

## 2024-03-08 ENCOUNTER — Other Ambulatory Visit: Payer: Self-pay | Admitting: Gastroenterology

## 2024-03-08 ENCOUNTER — Other Ambulatory Visit: Payer: Self-pay | Admitting: Medical

## 2024-03-08 DIAGNOSIS — Z1231 Encounter for screening mammogram for malignant neoplasm of breast: Secondary | ICD-10-CM

## 2024-03-10 ENCOUNTER — Ambulatory Visit
Admission: RE | Admit: 2024-03-10 | Discharge: 2024-03-10 | Disposition: A | Discharge status: Home / Self Care | Source: Ambulatory Visit

## 2024-03-10 DIAGNOSIS — Z1231 Encounter for screening mammogram for malignant neoplasm of breast: Secondary | ICD-10-CM

## 2024-03-13 NOTE — Progress Notes (Signed)
Mammogram shows area of concern and other imaging recommended.  She should be getting a call back.  If not heard back within 1 week, call us back.

## 2024-03-14 ENCOUNTER — Other Ambulatory Visit: Payer: Self-pay | Admitting: Medical

## 2024-03-14 DIAGNOSIS — R928 Other abnormal and inconclusive findings on diagnostic imaging of breast: Secondary | ICD-10-CM

## 2024-03-21 ENCOUNTER — Ambulatory Visit (INDEPENDENT_AMBULATORY_CARE_PROVIDER_SITE_OTHER): Payer: Self-pay | Admitting: Oncology

## 2024-03-21 ENCOUNTER — Other Ambulatory Visit: Payer: Self-pay

## 2024-03-21 ENCOUNTER — Encounter: Payer: Self-pay | Admitting: Oncology

## 2024-03-21 VITALS — BP 104/64 | HR 64 | Temp 98.4°F | Ht 65.0 in | Wt 165.0 lb

## 2024-03-21 DIAGNOSIS — J0140 Acute pansinusitis, unspecified: Secondary | ICD-10-CM

## 2024-03-21 MED ORDER — AMOXICILLIN-POT CLAVULANATE 875-125 MG PO TABS: 1.0000 | ORAL_TABLET | Freq: Two times a day (BID) | ORAL | 0 refills | Status: DC

## 2024-03-21 MED ORDER — PREDNISONE 20 MG PO TABS: 40.0000 mg | ORAL_TABLET | Freq: Every day | ORAL | 0 refills | Status: DC

## 2024-03-21 NOTE — Progress Notes (Signed)
 [facility]  [address] [place], [place] [address] Phone: [phone] Fax: [phone]   Office Visit Note  Patient Name: Lacey Hess  Date of [id number]  Med Rec number [id number]  Date of Service: 03/21/2024  Adhesive [tape]  Chief Complaint  Patient presents with   Acute Visit    Patient c/o sore throat, sinus pressure, headache, cough with occasional mucus production, and ringing in her ears. Denies fever. Symptoms began a week ago and seemed to worsen over the weekend. She states her coworker was COVID positive so she tested at home for COVID twice which was negative.     HPI Patient is an 43 y.o. with acute cough that started last Wednesday. Coworker had Covid.  She has tested herself twice and they both were negative.  Reports initially symptoms were cough with nasal congestion but now she is having pretty severe headaches, ringing in the ears, sore throat and sinus pain and pressure.  Reports she has had a sinus infection in the past but it has been a long time.  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  Feels like her sinuses are on fire.  Feels mildly off balance.  Has used DayQuil/NyQuil, throat lozenges and Tylenol.  She has been drinking a lot of water.  No known drug allergies.  No recent antibiotics.   Current Medication:  Outpatient Encounter Medications as of 03/21/2024  Medication Sig   acetaminophen (TYLENOL) 500 MG tablet Take 500-1,000 mg by mouth every 8 (eight) hours as needed (for pain).    amoxicillin -clavulanate (AUGMENTIN) 875-125 MG tablet Take 1 tablet by mouth 2 (two) times daily.   cyanocobalamin  (VITAMIN B12) 1000 MCG/ML injection Inject 1 mL (1,000 mcg total) into the skin every 14 (fourteen) days.   escitalopram (LEXAPRO) 20 MG tablet Take 20 mg by mouth daily.   etonogestrel -ethinyl estradiol  (NUVARING) 0.12-0.015 MG/24HR vaginal ring Place 1 each vaginally every 28  (twenty-eight) days. Insert vaginally and leave in place for 3 consecutive weeks, then remove for 1 week.   HUMIRA , 2 PEN, 40 MG/0.4ML pen INJECT THE CONTENTS OF 1 PEN UNDER THE SKIN EVERY 14 DAYS   modafinil (PROVIGIL) 100 MG tablet Take 200 mg by mouth every morning.   MOTEGRITY  2 MG TABS Take 1 tablet (2 mg total) by mouth daily.   Multiple Vitamin (MULTIVITAMIN) tablet Take 1 tablet by mouth daily.   ondansetron  (ZOFRAN  ODT) 4 MG disintegrating tablet Take 1 tablet (4 mg total) by mouth every 8 (eight) hours as needed for nausea or vomiting.   predniSONE (DELTASONE) 20 MG tablet Take 2 tablets (40 mg total) by mouth daily with breakfast.   Tuberculin-Allergy  Syringes 26G X 3/8" 1 ML MISC Use every 14 days to inject cyanocobalamin    No facility-administered encounter medications on file as of 03/21/2024.      Medical History: Past Medical History:  Diagnosis Date   Anxiety    B12 deficiency 2019   Crohn's disease (HCC) 12/2017     Vital Signs: BP 104/64   Pulse 64   Temp 98.4 F (36.9 C)   Ht 5\' 5"  (1.651 m)   Wt 165 lb (74.8 kg)   SpO2 94%   BMI 27.46 kg/m   ROS: As per HPI.  All other pertinent ROS negative.     Review of Systems  Constitutional:  Positive for diaphoresis and fatigue. Negative for chills and fever.  HENT:  Positive for congestion, ear  pain, postnasal drip, sinus pressure, sinus pain and sore throat.   Respiratory:  Positive for cough and shortness of breath. Negative for chest tightness.   Gastrointestinal:  Negative for constipation, diarrhea and nausea.  Musculoskeletal:  Negative for myalgias.  Neurological:  Positive for headaches. Negative for dizziness.    Physical Exam Vitals reviewed.  HENT:     Right Ear: A middle ear effusion is present.     Left Ear: A middle ear effusion is present.     Nose: Mucosal edema, congestion and rhinorrhea present.     Right Turbinates: Pale.     Left Turbinates: Pale.     Right Sinus: Maxillary sinus  tenderness and frontal sinus tenderness present.     Left Sinus: Maxillary sinus tenderness and frontal sinus tenderness present.     Mouth/Throat:     Mouth: Mucous membranes are moist.     Pharynx: Posterior oropharyngeal erythema and postnasal drip present.     Tonsils: 0 on the right. 0 on the left.  Cardiovascular:     Rate and Rhythm: Normal rate and regular rhythm.  Pulmonary:     Effort: Pulmonary effort is normal.     Breath sounds: Normal breath sounds.  Neurological:     Mental Status: She is alert.     No results found for this or any previous visit (from the past 24 hours).  Assessment/Plan: 1. Acute non-recurrent pansinusitis (Primary) Exam is consistent with pansinusitis.  We discussed Augmentin twice daily x 10 days along with prednisone 40 mg each morning x 3 days.  We discussed how and when to take both medications.  Please do not take prednisone too late in the day as it may keep you awake.  Take both medications with food to avoid nausea.  Continue OTC medications per our discussion until your symptoms improve and then you can stop the over-the-counter medications but continue your prescribed.  Please let me know if you are not improving over the next 7 to 10 days.  - amoxicillin -clavulanate (AUGMENTIN) 875-125 MG tablet; Take 1 tablet by mouth 2 (two) times daily.  Dispense: 20 tablet; Refill: 0 - predniSONE (DELTASONE) 20 MG tablet; Take 2 tablets (40 mg total) by mouth daily with breakfast.  Dispense: 6 tablet; Refill: 0   General Counseling: [name] verbalizes understanding of the findings of todays visit and agrees with plan of treatment. I have discussed any further diagnostic evaluation that may be needed or ordered today. We also reviewed her medications today. she has been encouraged to call the office with any questions or concerns that should arise related to todays visit.   No orders of the defined types were placed in this encounter.   Meds ordered this  encounter  Medications   amoxicillin -clavulanate (AUGMENTIN) 875-125 MG tablet    Sig: Take 1 tablet by mouth 2 (two) times daily.    Dispense:  20 tablet    Refill:  0   predniSONE (DELTASONE) 20 MG tablet    Sig: Take 2 tablets (40 mg total) by mouth daily with breakfast.    Dispense:  6 tablet    Refill:  0    I spent 20 minutes dedicated to the care of this patient (face-to-face and non-face-to-face) on the date of the encounter to include what is described in the assessment and plan.   Charlton Cooler, NP 03/21/2024 1:25 PM

## 2024-03-30 ENCOUNTER — Ambulatory Visit
Admission: RE | Admit: 2024-03-30 | Discharge: 2024-03-30 | Disposition: A | Discharge status: Home / Self Care | Source: Ambulatory Visit | Attending: Medical | Admitting: Medical

## 2024-03-30 ENCOUNTER — Ambulatory Visit: Payer: Self-pay | Admitting: Medical

## 2024-03-30 DIAGNOSIS — R928 Other abnormal and inconclusive findings on diagnostic imaging of breast: Secondary | ICD-10-CM

## 2024-03-30 NOTE — Progress Notes (Signed)
 Imaging shows no worrisome findings.  There is a benign cyst in the right breast.  Radiology recommends screening mammogram in 1 year

## 2024-04-03 ENCOUNTER — Encounter: Payer: Self-pay | Admitting: Oncology

## 2024-04-19 ENCOUNTER — Encounter: Payer: Self-pay | Admitting: Gastroenterology

## 2024-04-19 ENCOUNTER — Ambulatory Visit: Admitting: Gastroenterology

## 2024-04-19 ENCOUNTER — Other Ambulatory Visit (HOSPITAL_COMMUNITY): Payer: Self-pay

## 2024-04-19 ENCOUNTER — Other Ambulatory Visit: Payer: Self-pay | Admitting: *Deleted

## 2024-04-19 VITALS — BP 90/60 | HR 70 | Ht 65.0 in | Wt 172.4 lb

## 2024-04-19 DIAGNOSIS — Z91018 Allergy to other foods: Secondary | ICD-10-CM

## 2024-04-19 DIAGNOSIS — R519 Headache, unspecified: Secondary | ICD-10-CM | POA: Diagnosis not present

## 2024-04-19 DIAGNOSIS — R5383 Other fatigue: Secondary | ICD-10-CM | POA: Diagnosis not present

## 2024-04-19 DIAGNOSIS — K5080 Crohn's disease of both small and large intestine without complications: Secondary | ICD-10-CM

## 2024-04-19 DIAGNOSIS — K5909 Other constipation: Secondary | ICD-10-CM | POA: Diagnosis not present

## 2024-04-19 DIAGNOSIS — K5000 Crohn's disease of small intestine without complications: Secondary | ICD-10-CM | POA: Diagnosis not present

## 2024-04-19 DIAGNOSIS — Z79620 Long term (current) use of immunosuppressive biologic: Secondary | ICD-10-CM

## 2024-04-19 DIAGNOSIS — Z5181 Encounter for therapeutic drug level monitoring: Secondary | ICD-10-CM

## 2024-04-19 MED ORDER — ADALIMUMAB 40 MG/0.4ML ~~LOC~~ PSKT: 40.0000 mg | PREFILLED_SYRINGE | SUBCUTANEOUS | 3 refills | Status: DC

## 2024-04-19 NOTE — Progress Notes (Signed)
 Lacey Hess    [id number]    1980/12/26  Primary Care Physician:Tysinger, Christiane Cowing, PA-C  Referring Physician: Claudene Crystal, PA-C [address] [place],  [place] [address]   Chief complaint: Crohn's disease  Discussed the use of AI scribe software for clinical note transcription with the patient, who gave verbal consent to proceed.  History of Present Illness Lacey Hess is a 43 year old female with Crohn's disease who presents for a follow-up on her current treatment regimen.  She has been on Humira  for over five years for Crohn's disease, initially experiencing a decrease in symptom severity, but currently perceiving little effect. Her disease is primarily localized to the terminal ileum. She received a notice from her insurance about switching to a generic biosimilar due to coverage changes. Her last therapeutic level check for Humira  was in 2023, and previous colonoscopy showed healed ulcers.  Her bathroom habits have not changed significantly, and she is able to go to the bathroom at least every other day, attributing this to her use of Motegrity , which allows her to go three to four times a week. She occasionally uses MiraLAX for bloating and discomfort. She follows a low FODMAP diet for 16 weeks, identifying onions as a trigger for bloating, which is still present but less severe. She follows a mostly vegan diet and is cautious about her food choices to manage her Crohn's symptoms.  She experiences fatigue and more frequent headaches lasting 24 to 36 hours, sometimes up to two days, unsure if they are migraines and has not identified specific triggers. She updated her vision prescription recently, suspecting it might contribute to her headaches.  She has been working with a physical therapist but did not notice significant changes. Her pelvic floor musculature is always tight, which she manages with relaxation exercises. She finds Motegrity  helpful in managing  her bowel movements, as she tends to hold stool if it is not semi-formed.   GI Hx: Colonoscopy 09-01-22  - The examined portion of the ileum was normal.  - Non-bleeding external and internal hemorrhoids.  - The examination was otherwise normal.  - Simple Endoscopic Score for Crohn's Disease: 0, mucosal inflammatory changes secondary to Crohn's disease, in remission.  - No specimens collected.   Small bowel video capsule Mar 23, 2022: Incomplete exam.  Negative for active inflammation or Crohn's disease   CT abdomen pelvis February 25, 2022: Negative for any acute intra-abdominal pathology.  No bowel obstruction or thickening to suggest acute Crohn's flare   Right upper quadrant ultrasound December 16, 2021 was negative for any acute pathology, no cholelithiasis or cholecystitis   She is no longer following with dietitian or physical therapy, according to Keiana both of them felt that they have done everything and did not have any other options available to improve her symptoms   Mother diagnosed with Crohn's disease in 2021 and and has ileostomy with high output.  She is worried if she ever has to undergo a colostomy or an ileostomy   On Humira  biweekly injection.    Humira  antibody level detected at 28 and drug level 16 on September 06, 2018.    Colonoscopy December 06, 2017 showed multiple aphthous ulcers in terminal ileum, biopsy consistent with erosions and active inflammation. CT enterography April 2019 with contrast showed mild wall thickening and abnormal mucosal enhancement in terminal ileum highly suspicious for Crohn's disease.   Outpatient Encounter Medications as of 04/19/2024  Medication Sig  acetaminophen (TYLENOL) 500 MG tablet Take 500-1,000 mg by mouth every 8 (eight) hours as needed (for pain).    cyanocobalamin  (VITAMIN B12) 1000 MCG/ML injection Inject 1 mL (1,000 mcg total) into the skin every 14 (fourteen) days.   escitalopram (LEXAPRO) 20 MG tablet Take 20 mg by mouth  daily.   etonogestrel -ethinyl estradiol  (NUVARING) 0.12-0.015 MG/24HR vaginal ring Place 1 each vaginally every 28 (twenty-eight) days. Insert vaginally and leave in place for 3 consecutive weeks, then remove for 1 week.   HUMIRA , 2 PEN, 40 MG/0.4ML pen INJECT THE CONTENTS OF 1 PEN UNDER THE SKIN EVERY 14 DAYS   modafinil (PROVIGIL) 100 MG tablet Take 200 mg by mouth every morning.   MOTEGRITY  2 MG TABS Take 1 tablet (2 mg total) by mouth daily.   Multiple Vitamin (MULTIVITAMIN) tablet Take 1 tablet by mouth daily.   ondansetron  (ZOFRAN  ODT) 4 MG disintegrating tablet Take 1 tablet (4 mg total) by mouth every 8 (eight) hours as needed for nausea or vomiting.   Tuberculin-Allergy  Syringes 26G X 3/8 1 ML MISC Use every 14 days to inject cyanocobalamin    amoxicillin -clavulanate (AUGMENTIN ) 875-125 MG tablet Take 1 tablet by mouth 2 (two) times daily.   predniSONE  (DELTASONE ) 20 MG tablet Take 2 tablets (40 mg total) by mouth daily with breakfast.   No facility-administered encounter medications on file as of 04/19/2024.    Allergies as of 04/19/2024 - Review Complete 04/19/2024  Allergen Reaction Noted   Adhesive [tape] Rash 08/29/2018    Past Medical History:  Diagnosis Date   Anxiety    B12 deficiency 2019   Crohn's disease (HCC) 12/2017    Past Surgical History:  Procedure Laterality Date   ANAL RECTAL MANOMETRY N/A 12/16/2022   Procedure: ANO RECTAL MANOMETRY;  Surgeon: Sergio Dandy, MD;  Location: [facility];  Service: Gastroenterology;  Laterality: N/A;   BREAST EXCISIONAL BIOPSY Left    at age 62, benign excision   COLONOSCOPY  12/2017   mult ulcers in terminal ileum, othewise normal colon.  Dr. Darleen Ee    Family History  Problem Relation Age of Onset   Crohn's disease Mother    Hepatitis Father    Diabetes Maternal Aunt    Heart disease Paternal Aunt        CHF   Cancer Maternal Grandmother        lung   Breast cancer Paternal Grandmother    Cancer  Paternal Grandmother        breast   Heart disease Paternal Grandmother        CHF   Drug abuse Brother    Colon cancer Neg Hx    Esophageal cancer Neg Hx    Rectal cancer Neg Hx    Stomach cancer Neg Hx     Social History   Socioeconomic History   Marital status: Married    Spouse name: Not on file   Number of children: 0   Years of education: Not on file   Highest education level: Not on file  Occupational History   Occupation: [employer]  Tobacco Use   Smoking status: Never    Passive exposure: Past (Mother)   Smokeless tobacco: Never  Vaping Use   Vaping status: Never Used  Substance and Sexual Activity   Alcohol use: Yes    Comment: 2-3 glasses weekly    Drug use: No   Sexual activity: Not on file  Other Topics Concern   Not on file  Social History Narrative  Works at [employer], Public house manager of Students, Chief Strategy Officer.  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  Exercise - peloton, yoga, weights.  01/2020.     Social Drivers of Corporate investment banker Strain: Not on file  Food Insecurity: No Food Insecurity (02/10/2021)   Hunger Vital Sign    Worried About Running Out of Food in the Last Year: Never true    Ran Out of Food in the Last Year: Never true  Transportation Needs: No Transportation Needs (02/10/2021)   PRAPARE - Administrator, Civil Service (Medical): No    Lack of Transportation (Non-Medical): No  Physical Activity: Not on file  Stress: Not on file  Social Connections: Not on file  Intimate Partner Violence: Not on file      Review of systems: All other review of systems negative except as mentioned in the HPI.   Physical Exam: Vitals:   04/19/24 1015  BP: 90/60  Pulse: 70   Body mass index is 28.69 kg/m. Gen:      No acute distress HEENT:  sclera anicteric Abd:      soft, non-tender; no palpable masses, no distension Ext:    No edema Neuro: alert and oriented x 3 Psych: normal mood and affect  Data Reviewed:  Reviewed labs, radiology  imaging, old records and pertinent past GI work up  Assessment & Plan Crohn's disease Crohn's disease localized to the terminal ileum, currently in clinical remission with no recent flares. She has been on Humira  for over five years with therapeutic levels and healed ulcers on colonoscopy. Insurance mandates a switch to a biosimilar. Discussed risks of switching, including variations in drug levels and potential flare-ups. Given her family history of severe Crohn's disease, continued treatment is recommended. Monitoring therapeutic levels post-switch is crucial for efficacy. - Switch to biosimilar as per insurance requirements. - Check Humira  drug trough and antibody levels next Wednesday before the next injection. - After three doses of the biosimilar, perform blood work including CBC, CMP, CRP, fecal calprotectin, and drug trough and antibody levels.  Chronic constipation Chronic constipation managed with Motegrity , effective in maintaining regular bowel movements. Occasional MiraLAX use for bloating and discomfort. Pelvic floor tightness contributes to constipation. She incorporates relaxation exercises and dietary modifications. Sensation issues due to chronic constipation are being addressed through management strategies. - Continue Motegrity  for bowel regulation. - Use MiraLAX as needed for bloating and discomfort. - Continue relaxation exercises and dietary modifications to manage pelvic floor tightness.  Headaches Frequent headaches, possibly stress-related or secondary to Humira , lasting 24-36 hours. Discussed potential migraines and benefit of neurologist evaluation. Consideration of migraine medications if diagnosed. Headaches may also relate to vision changes or stress. - Discuss headache management with primary care provider, Ford Ide, and consider referral to a neurologist for evaluation of possible migraines.  Fatigue Persistent fatigue, potentially related to stress and  headaches. Recent blood work from integrative health provider included vitamins for low energy levels. She is exploring dietary changes to address fatigue. - Monitor fatigue levels and continue dietary modifications as needed.  Food sensitivity Food sensitivity with onions identified as a trigger for bloating. She follows a mostly vegan diet and has tried the low FODMAP diet with some success. Recent testing suggested sensitivity to eggs and bananas, which are temporarily eliminated. Incorporating a variety of foods is encouraged to support gut health. - Continue to avoid onions to manage bloating. - Temporarily eliminate eggs and bananas, then reintroduce to assess for any changes  in symptoms.   This visit required >40 minutes of patient care (this includes precharting, chart review, review of results, face-to-face time used for counseling as well as treatment plan and follow-up. The patient was provided an opportunity to ask questions and all were answered. The patient agreed with the plan and demonstrated an understanding of the instructions.  Lorena Rolling , MD    CC: Tysinger, Christiane Cowing, PA-C

## 2024-04-19 NOTE — Patient Instructions (Addendum)
 VISIT SUMMARY:  Today, we discussed your ongoing management of Crohn's disease, chronic constipation, headaches, fatigue, and food sensitivities. We reviewed your current treatment regimen and made some adjustments based on your symptoms and recent insurance changes.  YOUR PLAN:  -CROHN'S DISEASE: Crohn's disease is a chronic inflammatory condition of the gastrointestinal tract. Your disease is currently in clinical remission, but due to insurance changes, you will need to switch from Humira  to a biosimilar. We discussed the risks of switching, including potential variations in drug levels and flare-ups. You will need to check your Humira  drug trough and antibody levels next Wednesday before your next injection. After three doses of the biosimilar, we will perform blood work to monitor your condition.  -CHRONIC CONSTIPATION: Chronic constipation is a condition where you have infrequent or difficult bowel movements. You are managing this with Motegrity , which helps maintain regular bowel movements, and occasional use of MiraLAX for bloating and discomfort. Continue with your relaxation exercises and dietary modifications to manage pelvic floor tightness.  -HEADACHES: You have been experiencing frequent headaches that last 24-36 hours, which may be related to stress, Humira , or vision changes. We discussed the possibility of migraines and the benefit of seeing a neurologist for further evaluation. You should discuss headache management with your primary care provider, Nelda Balsam.  -FATIGUE: Fatigue is a feeling of extreme tiredness. Your fatigue may be related to stress and headaches. You have been exploring dietary changes to address this issue. Continue to monitor your fatigue levels and adjust your diet as needed.  -FOOD SENSITIVITY: Food sensitivity occurs when certain foods cause symptoms like bloating. You have identified onions as a trigger and are currently avoiding them. You are also  temporarily eliminating eggs and bananas based on recent testing. Continue to avoid onions and reintroduce eggs and bananas later to see if symptoms change.  INSTRUCTIONS:  Please check your Humira  drug trough and antibody levels next Wednesday before your next injection. After three doses of the biosimilar, we will perform blood work including CBC, CMP, CRP, fecal calprotectin, and drug trough and antibody levels. Discuss headache management with your primary care provider, Ford Ide, and consider a referral to a neurologist for evaluation of possible migraines.  Due to recent changes in healthcare laws, you may see the results of your imaging and laboratory studies on MyChart before your provider has had a chance to review them.  We understand that in some cases there may be results that are confusing or concerning to you. Not all laboratory results come back in the same time frame and the provider may be waiting for multiple results in order to interpret others.  Please give us  48 hours in order for your provider to thoroughly review all the results before contacting the office for clarification of your results.    I appreciate the  opportunity to care for you  Thank You   Kavitha Nandigam , MD

## 2024-05-04 ENCOUNTER — Telehealth: Payer: Self-pay

## 2024-05-04 ENCOUNTER — Other Ambulatory Visit (HOSPITAL_COMMUNITY): Payer: Self-pay

## 2024-05-04 MED ORDER — AMJEVITA 40 MG/0.4ML ~~LOC~~ SOAJ: 40.0000 mg | SUBCUTANEOUS | 11 refills | Status: AC

## 2024-05-04 NOTE — Telephone Encounter (Signed)
Dr Nandigam please advise 

## 2024-05-04 NOTE — Telephone Encounter (Signed)
 Changing to [place]  per insurance request. Discussed at last office visit. Okay per Dr Shila.

## 2024-05-04 NOTE — Telephone Encounter (Signed)
 Pharmacy Patient Advocate Encounter   Received notification  that prior authorization for Humira  (2 Pen) (CF) 40MG /0.4ML auto-injector kit is required/requested.   Insurance verification completed.   The patient is insured through [facility] .   Per test claim: Prior Authorization form/request asks a question that requires your assistance. Please see the question below and advise accordingly. The PA will not be submitted until the necessary information is received.

## 2024-05-04 NOTE — Telephone Encounter (Signed)
 Agree

## 2024-05-22 ENCOUNTER — Ambulatory Visit (INDEPENDENT_AMBULATORY_CARE_PROVIDER_SITE_OTHER): Payer: Self-pay | Admitting: Family Medicine

## 2024-05-22 ENCOUNTER — Encounter: Payer: Self-pay | Admitting: Family Medicine

## 2024-05-22 ENCOUNTER — Other Ambulatory Visit: Payer: Self-pay

## 2024-05-22 VITALS — BP 90/70 | HR 90 | Temp 98.2°F | Ht 65.0 in | Wt 165.0 lb

## 2024-05-22 DIAGNOSIS — R21 Rash and other nonspecific skin eruption: Secondary | ICD-10-CM

## 2024-05-22 NOTE — Progress Notes (Signed)
 Assessment & Plan:  Patient ID: Lacey Hess, female    DOB: 10/05/1981  Age: 43 y.o. MRN: [id number]  Problem List Items Addressed This Visit   None Visit Diagnoses       Rash and nonspecific skin eruption    -  Primary       Advise to try over the counter antihistamine orally for short term to see if this will help.   Continue with topical otc hydrocortisone . If symptoms persist or if new symptoms arise then will f/u here, with PCP or will refer.   There are no discontinued medications.  Follow-up: Return if symptoms worsen or fail to improve.    Subjective:    CC: The encounter diagnosis was Rash and nonspecific skin eruption.  HPI Lacey Hess presents for a nonspecific rash that started on her arms several days ago, small red spots would pop up on one forearm and then the other, they are itchy, has been applying topical antihistamine and also hydrocortisone  cream. No new foods, no new meds, outdoor exposure, no other rashes on any other body parts, no fevers or chills, URI or GI sxs, no muscle aches or pains, numbness,weakness, tingling or any other symptoms, no one else in the house has it, no new lotions/soaps. Only thing different is she went to [place] with her her husband. He does not have any rashes. They do have pets but there are no ticks or bugs on the pets. She denies working outside in the garden or have any poison oak or ivy exposure. Has not seen any bed bugs.   History Pam has a past medical history of Anxiety, B12 deficiency (2019), and Crohn's disease (HCC) (12/2017).   She has a past surgical history that includes Colonoscopy (12/2017); Anal Rectal manometry (N/A, 12/16/2022); and Breast excisional biopsy (Left).   Her family history includes Breast cancer in her paternal grandmother; Cancer in her maternal grandmother and paternal grandmother; Crohn's disease in her mother; Diabetes in her maternal aunt; Drug abuse in her brother; Heart disease in her  paternal aunt and paternal grandmother; Hepatitis in her father.She reports that she has never smoked. She has been exposed to tobacco smoke. She has never used smokeless tobacco. She reports current alcohol use. She reports that she does not use drugs.  Outpatient Medications Prior to Visit  Medication Sig Dispense Refill   acetaminophen (TYLENOL) 500 MG tablet Take 500-1,000 mg by mouth every 8 (eight) hours as needed (for pain).      Adalimumab -atto (AMJEVITA ) 40 MG/0.4ML SOAJ Inject 40 mg into the skin every 14 (fourteen) days. 0.8 mL 11   cyanocobalamin  (VITAMIN B12) 1000 MCG/ML injection Inject 1 mL (1,000 mcg total) into the skin every 14 (fourteen) days. 6 mL 4   escitalopram (LEXAPRO) 20 MG tablet Take 20 mg by mouth daily.     etonogestrel -ethinyl estradiol  (NUVARING) 0.12-0.015 MG/24HR vaginal ring Place 1 each vaginally every 28 (twenty-eight) days. Insert vaginally and leave in place for 3 consecutive weeks, then remove for 1 week. 3 each 3   modafinil (PROVIGIL) 100 MG tablet Take 200 mg by mouth every morning.     MOTEGRITY  2 MG TABS Take 1 tablet (2 mg total) by mouth daily. 90 tablet 3   Multiple Vitamin (MULTIVITAMIN) tablet Take 1 tablet by mouth daily.     ondansetron  (ZOFRAN  ODT) 4 MG disintegrating tablet Take 1 tablet (4 mg total) by mouth every 8 (eight) hours as needed for nausea or vomiting. 20 tablet 0  Tuberculin-Allergy  Syringes 26G X 3/8 1 ML MISC Use every 14 days to inject cyanocobalamin  25 each 3   No facility-administered medications prior to visit.    Review of Systems: as above in HPI, + rash   Objective:  BP 90/70   Pulse 90   Temp 98.2 F (36.8 C)   Ht 5' 5 (1.651 m)   Wt 165 lb (74.8 kg)   SpO2 99%   BMI 27.46 kg/m   Physical Exam GEN: A&O x 3, NAD, well nourished HEENT: Normocephalic, atraumatic, EOMI, OP patent RESP: CTAB, no R/R/W CV: RRR, no M/R/G ABDOMEN: soft, nontender, normal BS, no HSM VAS: no Oliveah Zwack swelling NEURO: A&O x 3, normal  gait PSYCH: normal speech, normal mood MSK: normal ROM in UE and Brandilynn Taormina  SKIN: + small slightly raised red, pencil tip punctate lesions on bilateral forearms, < 10 lesions, no specific patterns.    Tashanti Dalporto Phuong Handsome Anglin, DO

## 2024-05-24 ENCOUNTER — Ambulatory Visit: Admitting: Medical

## 2024-06-06 ENCOUNTER — Other Ambulatory Visit (INDEPENDENT_AMBULATORY_CARE_PROVIDER_SITE_OTHER)

## 2024-06-06 ENCOUNTER — Other Ambulatory Visit: Payer: Self-pay

## 2024-06-06 DIAGNOSIS — K5080 Crohn's disease of both small and large intestine without complications: Secondary | ICD-10-CM | POA: Diagnosis not present

## 2024-06-06 DIAGNOSIS — Z79620 Long term (current) use of immunosuppressive biologic: Secondary | ICD-10-CM | POA: Diagnosis not present

## 2024-06-06 LAB — COMPREHENSIVE METABOLIC PANEL WITH GFR
ALT: 12 U/L (ref 0–35)
AST: 14 U/L (ref 0–37)
Albumin: 3.9 g/dL (ref 3.5–5.2)
Alkaline Phosphatase: 43 U/L (ref 39–117)
BUN: 9 mg/dL (ref 6–23)
CO2: 27 meq/L (ref 19–32)
Calcium: 8.8 mg/dL (ref 8.4–10.5)
Chloride: 104 meq/L (ref 96–112)
Creatinine, Ser: 0.69 mg/dL (ref 0.40–1.20)
GFR: 108.03 mL/min (ref >60.00)
Glucose, Bld: 68 mg/dL — ABNORMAL LOW (ref 70–99)
Potassium: 4.1 meq/L (ref 3.5–5.1)
Sodium: 138 meq/L (ref 135–145)
Total Bilirubin: 0.3 mg/dL (ref 0.2–1.2)
Total Protein: 6.7 g/dL (ref 6.0–8.3)

## 2024-06-06 LAB — CBC
HCT: 36.3 % (ref 36.0–46.0)
Hemoglobin: 12.1 g/dL (ref 12.0–15.0)
MCHC: 33.3 g/dL (ref 30.0–36.0)
MCV: 94.9 fl (ref 78.0–100.0)
Platelets: 209 K/uL (ref 150.0–400.0)
RBC: 3.83 Mil/uL — ABNORMAL LOW (ref 3.87–5.11)
RDW: 13.1 % (ref 11.5–15.5)
WBC: 6.3 K/uL (ref 4.0–10.5)

## 2024-06-06 LAB — HIGH SENSITIVITY CRP: CRP, High Sensitivity: 2.61 mg/L (ref 0.000–5.000)

## 2024-06-10 ENCOUNTER — Encounter: Payer: Self-pay | Admitting: Gastroenterology

## 2024-06-12 ENCOUNTER — Other Ambulatory Visit: Payer: Self-pay

## 2024-06-12 MED ORDER — PRUCALOPRIDE SUCCINATE 2 MG PO TABS: 2.0000 mg | ORAL_TABLET | Freq: Every day | ORAL | 3 refills | Status: AC

## 2024-06-15 LAB — ADALIMUMAB+AB (SERIAL MONITOR)
Adalimumab Drug Level: 14 ug/mL
Anti-Adalimumab Antibody: <25 ng/mL

## 2024-06-15 LAB — SERIAL MONITORING

## 2024-07-05 ENCOUNTER — Other Ambulatory Visit: Payer: Self-pay | Admitting: Certified Nurse Midwife

## 2024-07-05 DIAGNOSIS — Z30019 Encounter for initial prescription of contraceptives, unspecified: Secondary | ICD-10-CM

## 2024-08-14 ENCOUNTER — Encounter: Payer: Self-pay | Admitting: Medical

## 2024-08-14 ENCOUNTER — Other Ambulatory Visit: Payer: Self-pay

## 2024-08-14 ENCOUNTER — Ambulatory Visit: Payer: Self-pay | Admitting: Medical

## 2024-08-14 VITALS — BP 102/72 | HR 74 | Temp 98.9°F | Ht 65.0 in | Wt 170.0 lb

## 2024-08-14 DIAGNOSIS — L739 Follicular disorder, unspecified: Secondary | ICD-10-CM

## 2024-08-14 MED ORDER — CEPHALEXIN 500 MG PO CAPS: 500.0000 mg | ORAL_CAPSULE | Freq: Two times a day (BID) | ORAL | 0 refills | Status: AC

## 2024-08-14 NOTE — Progress Notes (Signed)
 [facility] [address] [place], [place] [address]   Office Visit Note  Patient Name: Lacey Hess Date of Birth [id number]  Medical Record number [id number]  Date of Service: 08/14/2024  Chief Complaint  Patient presents with   Rash     Rash Pertinent negatives include no fever.   43 y.o. female presents with  rash.  Noted what seemed like a blister to left inguinal area on 9/26. This went away same day but developed few additional bumps to inguinal area/mons pubis in following days. Seemed to spread quickly in last few days but slightly better today. Only painful when affected areas rub on underwear. Not itchy.   Same sexual partner x years. No lesions to labia. No vaginal discharge, itching or pain. No new products. Tried some OTC hydrocortisone  a few times without change.  Works out often.   Has Crohn's disease, takes immunosuppressive treatment.  Current Medication:  Outpatient Encounter Medications as of 08/14/2024  Medication Sig   acetaminophen (TYLENOL) 500 MG tablet Take 500-1,000 mg by mouth every 8 (eight) hours as needed (for pain).    Adalimumab -atto (AMJEVITA ) 40 MG/0.4ML SOAJ Inject 40 mg into the skin every 14 (fourteen) days.   CYANOCOBALAMIN  PO Take by mouth daily.   ELURYNG 0.12-0.015 MG/24HR vaginal ring INSERT 1 RING VAGINALLY AND LEAVE IN PLACE FOR 3 CONCECUTIVE WEEKS THEN REMOVE FOR 1 WEEK.   escitalopram (LEXAPRO) 20 MG tablet Take 20 mg by mouth daily.   MAGNESIUM PO Take by mouth daily.   modafinil (PROVIGIL) 100 MG tablet Take 200 mg by mouth every morning.   Multiple Vitamin (MULTIVITAMIN) tablet Take 1 tablet by mouth daily.   Multiple Vitamins-Minerals (ZINC PO) Take by mouth daily.   ondansetron  (ZOFRAN  ODT) 4 MG disintegrating tablet Take 1 tablet (4 mg total) by mouth every 8 (eight) hours as needed for nausea or vomiting.   OVER THE COUNTER MEDICATION daily. Black cumin seed oil   Prasterone, DHEA, (DHEA PO) Take  by mouth daily.   Probiotic Product (PROBIOTIC-10 PO) Take by mouth daily.   Prucalopride Succinate  (MOTEGRITY ) 2 MG TABS Take 1 tablet (2 mg total) by mouth daily.   Tuberculin-Allergy  Syringes 26G X 3/8 1 ML MISC Use every 14 days to inject cyanocobalamin    [DISCONTINUED] cyanocobalamin  (VITAMIN B12) 1000 MCG/ML injection Inject 1 mL (1,000 mcg total) into the skin every 14 (fourteen) days.   No facility-administered encounter medications on file as of 08/14/2024.      Medical History: Past Medical History:  Diagnosis Date   Anxiety    B12 deficiency 2019   Crohn's disease (HCC) 12/2017     Vital Signs: BP 102/72   Pulse 74   Temp 98.9 F (37.2 C)   Ht 5' 5 (1.651 m)   Wt 170 lb (77.1 kg)   SpO2 97%   BMI 28.29 kg/m    Review of Systems  Constitutional:  Negative for chills and fever.  Genitourinary:  Negative for vaginal discharge and vaginal pain.  Skin:  Positive for rash.    Physical Exam Vitals reviewed.  Constitutional:      General: She is not in acute distress.    Appearance: She is not ill-appearing.  Genitourinary:    Comments: Genital exam decined by patient - denies any lesions to labia, perineum or anus.  Skin:    Comments: Several small (~2-3 mm) moderately erythematous macules and a few small pustules located around hair follicles to bilateral medial thighs.  Neurological:     Mental Status: She is alert.     Assessment/Plan: 1. Folliculitis (Primary) Appearance of rash and history provided by patient consistent with folliculitis.  Discussed this is caused by a bacterial infection, likely staph.  Does not sound consistent with HSV or molluscum.  Will treat with a short course of oral antibiotic.  Advised to get out of sweaty clothing as soon as possible after exercise.  Avoid using loofah or other similar sponges.  Do not share towels with others.  - cephALEXin (KEFLEX) 500 MG capsule; Take 1 capsule (500 mg total) by mouth 2 (two) times  daily for 7 days.  Dispense: 14 capsule; Refill: 0  Patient Instructions  -Take complete course of antibiotics as prescribed.  Take with food.   -Do not use Loofah or similar sponges.  Silicone scrubber's which clean regularly in the dishwasher are okay. -Do not share towels or washcloths with others. -Make sure to get out of sweaty underwear/clothing as soon as possible after exercise. -Wear cotton underwear if possible to allow air circulation. -Send MyChart message to provider or schedule return visit as needed for new/worsening symptoms or if symptoms do not resolve as discussed with prescribed treatment over the next 5 to 7 days.      General Counseling: Anthonette verbalizes understanding of the findings of todays visit and plan of treatment. she has been encouraged to call the office with any questions or concerns that should arise related to todays visit.    Time spent:20 Minutes    Joen Arts PA-C Physician Assistant

## 2024-08-14 NOTE — Patient Instructions (Signed)
-  Take complete course of antibiotics as prescribed.  Take with food.   -Do not use Loofah or similar sponges.  Silicone scrubber's which clean regularly in the dishwasher are okay. -Do not share towels or washcloths with others. -Make sure to get out of sweaty underwear/clothing as soon as possible after exercise. -Wear cotton underwear if possible to allow air circulation. -Send MyChart message to provider or schedule return visit as needed for new/worsening symptoms or if symptoms do not resolve as discussed with prescribed treatment over the next 5 to 7 days.

## 2024-09-20 ENCOUNTER — Ambulatory Visit: Admitting: Certified Nurse Midwife

## 2024-09-20 ENCOUNTER — Ambulatory Visit (INDEPENDENT_AMBULATORY_CARE_PROVIDER_SITE_OTHER): Admitting: Obstetrics and Gynecology

## 2024-09-20 VITALS — BP 117/66 | HR 66 | Wt 177.0 lb

## 2024-09-20 DIAGNOSIS — N898 Other specified noninflammatory disorders of vagina: Secondary | ICD-10-CM

## 2024-09-20 MED ORDER — ESTRADIOL 0.01 % VA CREA: TOPICAL_CREAM | VAGINAL | 12 refills | Status: AC

## 2024-09-20 NOTE — Progress Notes (Signed)
 GYNECOLOGY VISIT  Patient name: Kashawn Manzano MRN [id number]  Date of birth: 05/03/1981 Chief Complaint:   Gynecologic Exam  History:  Discussed the use of AI scribe software for clinical note transcription with the patient, who gave verbal consent to proceed.  History of Present Illness Tanyika Barros is a 43 year old female who presents with vaginal discomfort and possible perimenopausal symptoms.  She experiences vaginal discomfort described as an 'achiness' primarily at the entry of the vagina. This discomfort is periodic and not associated with intercourse. She has not used any treatments for this issue. Additionally, there is a slight sensation of dryness, though it is not persistent. She is wondering if vaginal estrogen would be of benefit to her.   She has been using a contraceptive ring continuously to skip periods for about ten years, and reports that she has not experienced cramps during this time. She has noticed these vaginal changes over the past couple of months, occurring off and on.  She mentions a decrease in libido and possible hot flashes, which she is unsure about. The hot flashes, if present, occur mostly at night and are more noticeable in the summer or when the heat is on during winter. No pain with intercourse, although she is not engaging in it frequently due to low libido. Mentions will be attempting to conceive with wife as carrier.   She reports fatigue, weight gain despite being very active, and brain fog, which she attributes to interrupted sleep. She has a history of Crohn's disease, which she feels may be contributing to her fatigue. She is unsure if her symptoms are related to perimenopausal changes.    The following portions of the patient's history were reviewed and updated as appropriate: allergies, current medications, past family history, past medical history, past social history, past surgical history and problem list.   Health Maintenance:   Last  pap     Component Value Date/Time   DIAGPAP  11/18/2022 0904    - Negative for intraepithelial lesion or malignancy (NILM)   DIAGPAP  01/04/2020 1601    - Negative for intraepithelial lesion or malignancy (NILM)   HPVHIGH Negative 11/18/2022 0904   HPVHIGH Negative 01/04/2020 1601   ADEQPAP  11/18/2022 0904    Satisfactory for evaluation; transformation zone component ABSENT.   ADEQPAP  01/04/2020 1601    Satisfactory for evaluation; transformation zone component PRESENT.    Health Maintenance  Topic Date Due   COVID-19 Vaccine (1) Never done   HIV Screening  Never done   DTaP/Tdap/Td vaccine (1 - Tdap) Never done   Hepatitis B Vaccine (1 of 3 - 19+ 3-dose series) Never done   HPV Vaccine (1 - 3-dose SCDM series) Never done   Flu Shot  Never done   Breast Cancer Screening  03/10/2026   Colon Cancer Screening  09/02/2027   Pap with HPV screening  11/19/2027   Hepatitis C Screening  Completed   Pneumococcal Vaccine  Aged Out   Meningitis B Vaccine  Aged Out      Review of Systems:  Pertinent items are noted in HPI. Comprehensive review of systems was otherwise negative.   Objective:  Physical Exam BP 117/66   Pulse 66   Wt 177 lb (80.3 kg)   BMI 29.45 kg/m    Physical Exam Vitals and nursing note reviewed. Exam conducted with a chaperone present.  Constitutional:      Appearance: Normal appearance.  HENT:     Head: Normocephalic and  atraumatic.  Pulmonary:     Effort: Pulmonary effort is normal.     Breath sounds: Normal breath sounds.  Genitourinary:    General: Normal vulva.     Exam position: Lithotomy position.     Vagina: Normal.     Cervix: Normal.     Comments: Allodynia around vaginal introitus Skin:    General: Skin is warm and dry.  Neurological:     General: No focal deficit present.     Mental Status: She is alert.  Psychiatric:        Mood and Affect: Mood normal.        Behavior: Behavior normal.        Thought Content: Thought content  normal.        Judgment: Judgment normal.        Assessment & Plan:   Assessment & Plan Vaginal discomfort and dryness Intermittent vaginal discomfort and dryness not related to intercourse. Vaginal estrogen deemed safe for long-term use with expected improvement in 2-3 months. - Recommended vaginal estrogen for 2 weeks, then 2-3 nights per week. Reviewed may take months for improvement - With improved vaginal comfort, may have increase in libido, otherwise can explore additional medication management, options including Addyi + Contraceptive ring, SNRI + contraceptive ring, or E2 + suppressive progestin; reviewed typical progesterone for HT (prometrium) may not be enough suppress menses but can help with perimenopausal symptoms   Perimenopausal symptoms (hot flashes, fatigue, weight gain, brain fog) Symptoms suggestive of perimenopause. Discussed management options for hot flashes, including non-hormonal medications and hormone therapy adjustments. Explained risks of unopposed estrogen and lifestyle modifications for hot flashes. - Consider non-hormonal medications for hot flashes if continuing contraceptive ring. - Discussed potential hormone therapy adjustments if switching from contraceptive ring - would be estradiol  with either oral progestin or possibly IUD to allow for continued control of dysmenorrhea - Implement lifestyle modifications for hot flashes.    Carter Quarry, MD [facility], [facility]

## 2024-09-20 NOTE — Patient Instructions (Addendum)
 Vaginal estrogen for vaginal dryness  Continue using vaginal ring to control menstrual cramps.   When treating perimenopausal symptoms, typically can use hormone or non-hormonal options (estrogen and progesterone while you have a uterus in place).  Non-hormones are helpful while you still are using the vaginal ring to control your periods Or consider stopping vaginal ring and using different combination of estrogen and progesterone to help with perimenopause symptoms and control periods

## 2024-09-29 ENCOUNTER — Other Ambulatory Visit: Payer: Self-pay | Admitting: Certified Nurse Midwife

## 2024-09-29 DIAGNOSIS — Z30019 Encounter for initial prescription of contraceptives, unspecified: Secondary | ICD-10-CM

## 2024-10-02 ENCOUNTER — Encounter: Payer: Self-pay | Admitting: Gastroenterology

## 2024-12-06 ENCOUNTER — Ambulatory Visit: Admitting: Medical

## 2024-12-06 VITALS — BP 110/70 | HR 63 | Wt 183.8 lb

## 2024-12-06 DIAGNOSIS — J3489 Other specified disorders of nose and nasal sinuses: Secondary | ICD-10-CM | POA: Diagnosis not present

## 2024-12-06 DIAGNOSIS — H9190 Unspecified hearing loss, unspecified ear: Secondary | ICD-10-CM

## 2024-12-06 DIAGNOSIS — R040 Epistaxis: Secondary | ICD-10-CM | POA: Diagnosis not present

## 2024-12-06 DIAGNOSIS — H6121 Impacted cerumen, right ear: Secondary | ICD-10-CM

## 2024-12-06 MED ORDER — MUPIROCIN 2 % EX OINT: 1.0000 | TOPICAL_OINTMENT | Freq: Two times a day (BID) | CUTANEOUS | 1 refills | Status: AC

## 2024-12-06 NOTE — Addendum Note (Signed)
 Addended by: BULAH ALM RAMAN on: 12/06/2024 04:37 PM   Modules accepted: Orders

## 2024-12-06 NOTE — Progress Notes (Addendum)
 "  Name: Lacey Hess   Date of Visit: 12/06/24   CHIEF COMPLAINT:  Chief Complaint  Patient presents with   Acute Visit    Nasal dryness for a couple months. Having a bloody nose all the time. Would also like hearing check and feels pressure in ears and having to ask people to repeat themselves       HPI:  Discussed the use of AI scribe software for clinical note transcription with the patient, who gave verbal consent to proceed.  History of Present Illness    Lacey Hess is a 44 year old female with Crohn's disease who presents with persistent nasal dryness and nosebleeds.  She has been experiencing persistent nasal dryness and epistaxis, particularly during the fall season, for about two months. The epistaxis is relentless and primarily affects the right side. Despite using a saline nasal solution twice daily and applying Aquaphor inside her nose, there has been no improvement in her symptoms. She recently purchased a humidifier to help alleviate the dryness.  She denies having dry mouth and states that the dryness is mainly in her nose. She has not noticed any skin sores or exposure to MRSA bacteria at home.  She uses a gas radiator heating system at home.   Hearing seems decreased.  Wants her hearing checked   No other aggravating or relieving factors. No other complaint.   Past Medical History:  Diagnosis Date   Anxiety    B12 deficiency 2019   Crohn's disease (HCC) 12/2017   Current Outpatient Medications on File Prior to Visit  Medication Sig Dispense Refill   acetaminophen (TYLENOL) 500 MG tablet Take 500-1,000 mg by mouth every 8 (eight) hours as needed (for pain).      Adalimumab -atto (AMJEVITA ) 40 MG/0.4ML SOAJ Inject 40 mg into the skin every 14 (fourteen) days. 0.8 mL 11   CYANOCOBALAMIN  PO Take by mouth daily.     ELURYNG 0.12-0.015 MG/24HR vaginal ring INSERT 1 RING VAGINALLY AND LEAVE IN PLACE FOR 3 CONSECUTIVE WEEKS THEN REMOVE FOR 1 WEEK 3 each 0    escitalopram (LEXAPRO) 20 MG tablet Take 20 mg by mouth daily.     estradiol  (ESTRACE ) 0.01 % CREA vaginal cream Apply 1 gram per vagina every night for 2 weeks, then apply two to three times a week 42.5 g 12   modafinil (PROVIGIL) 100 MG tablet Take 200 mg by mouth every morning.     Multiple Vitamin (MULTIVITAMIN) tablet Take 1 tablet by mouth daily.     Multiple Vitamins-Minerals (ZINC PO) Take by mouth daily.     ondansetron  (ZOFRAN  ODT) 4 MG disintegrating tablet Take 1 tablet (4 mg total) by mouth every 8 (eight) hours as needed for nausea or vomiting. 20 tablet 0   Prucalopride Succinate  (MOTEGRITY ) 2 MG TABS Take 1 tablet (2 mg total) by mouth daily. 90 tablet 3   Tuberculin-Allergy  Syringes 26G X 3/8 1 ML MISC Use every 14 days to inject cyanocobalamin  25 each 3   No current facility-administered medications on file prior to visit.      OBJECTIVE:    BP 110/70   Pulse 63   Wt 183 lb 12.8 oz (83.4 kg)   SpO2 98%   BMI 30.59 kg/m    General appearence: alert, no distress, WD/WN,  Right ear with impacted cerumen.  Left TM normal-appearing and canal normal Bilateral nares with erythema and inflamed appearing mucosa.  No obvious nosebleed currently. HEENT otherwise normal    ASSESSMENT/PLAN:  Encounter Diagnoses  Name Primary?   Dry nose Yes   Nosebleed    Decreased hearing, unspecified laterality    Impacted cerumen of right ear    Hearing screen normal  Discussed findings.  Discussed risk/benefits of procedure and patient agrees to procedure. Successfully used warm water lavage to remove impacted cerumen from right ear canal. Patient tolerated procedure well. Advised they avoid using any cotton swabs or other devices to clean the ear canals.  Use basic hygiene as discussed.  Follow up prn.   Dry nose, nosebleeds Chronic nasal dryness with recurrent epistaxis, likely exacerbated by dry air and dry heat at home, also contributing could be medication such as  modafinil,  and environmental factors.  - Use mupirocin  ointment in both nostrils twice daily for one week. - Utilize a humidifier in the primary room. - Increase water intake to at least 80 ounces daily. - Limit salt, caffeine, and alcohol intake. - Use nasal saline spray or mist regularly. - Consider Vaseline use after mupirocin  treatment. - Call recheck if not improving or resolving over the next 2 weeks   Lacey Hess was seen today for acute visit.  Diagnoses and all orders for this visit:  Dry nose  Nosebleed  Decreased hearing, unspecified laterality  Impacted cerumen of right ear  Other orders -     mupirocin  ointment (BACTROBAN ) 2 %; Apply 1 Application topically 2 (two) times daily.    Follow-up as needed  [facility] "
